# Patient Record
Sex: Female | Born: 1995 | Race: White | Hispanic: No | Marital: Married | Smoking: Never smoker
Health system: Southern US, Community
[De-identification: ages and names within clinical notes are randomized; demographics above are authoritative.]

## PROBLEM LIST (undated history)

## (undated) DIAGNOSIS — J45909 Unspecified asthma, uncomplicated: Secondary | ICD-10-CM

## (undated) DIAGNOSIS — F419 Anxiety disorder, unspecified: Secondary | ICD-10-CM

## (undated) DIAGNOSIS — F329 Major depressive disorder, single episode, unspecified: Secondary | ICD-10-CM

## (undated) DIAGNOSIS — R519 Headache, unspecified: Secondary | ICD-10-CM

## (undated) DIAGNOSIS — F32A Depression, unspecified: Secondary | ICD-10-CM

## (undated) HISTORY — DX: Depression, unspecified: F32.A

## (undated) HISTORY — DX: Major depressive disorder, single episode, unspecified: F32.9

## (undated) HISTORY — PX: CYSTECTOMY: SUR359

## (undated) HISTORY — PX: WISDOM TOOTH EXTRACTION: SHX21

## (undated) HISTORY — DX: Unspecified asthma, uncomplicated: J45.909

## (undated) HISTORY — PX: TONSILLECTOMY: SUR1361

## (undated) HISTORY — DX: Headache, unspecified: R51.9

## (undated) HISTORY — DX: Anxiety disorder, unspecified: F41.9

---
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
## 2016-04-22 ENCOUNTER — Encounter: Payer: Self-pay | Admitting: Neurology

## 2016-04-22 ENCOUNTER — Ambulatory Visit (INDEPENDENT_AMBULATORY_CARE_PROVIDER_SITE_OTHER): Payer: 59 | Admitting: Neurology

## 2016-04-22 VITALS — BP 109/66 | HR 77 | Ht 64.0 in | Wt 143.2 lb

## 2016-04-22 DIAGNOSIS — R51 Headache with orthostatic component, not elsewhere classified: Secondary | ICD-10-CM

## 2016-04-22 DIAGNOSIS — G43709 Chronic migraine without aura, not intractable, without status migrainosus: Secondary | ICD-10-CM | POA: Diagnosis not present

## 2016-04-22 DIAGNOSIS — R413 Other amnesia: Secondary | ICD-10-CM

## 2016-04-22 DIAGNOSIS — G8929 Other chronic pain: Secondary | ICD-10-CM

## 2016-04-22 DIAGNOSIS — R410 Disorientation, unspecified: Secondary | ICD-10-CM

## 2016-04-22 DIAGNOSIS — F05 Delirium due to known physiological condition: Secondary | ICD-10-CM

## 2016-04-22 DIAGNOSIS — S0990XA Unspecified injury of head, initial encounter: Secondary | ICD-10-CM

## 2016-04-22 MED ORDER — PROCHLORPERAZINE MALEATE 10 MG PO TABS: 10.0000 mg | ORAL_TABLET | Freq: Four times a day (QID) | ORAL | 4 refills | Status: DC | PRN

## 2016-04-22 MED ORDER — PROPRANOLOL HCL 10 MG PO TABS: 20.0000 mg | ORAL_TABLET | Freq: Two times a day (BID) | ORAL | 6 refills | Status: DC

## 2016-04-22 MED ORDER — METHYLPREDNISOLONE 4 MG PO TBPK: ORAL_TABLET | ORAL | 1 refills | Status: DC

## 2016-04-22 NOTE — Progress Notes (Signed)
Faxed printed rx compazine, inderal, and medrol dosepak to  [id number] - [place], [place] - [address] [phone] [phone] (Phone) [phone] (Fax)   Fax: [phone]. Received  Confirmation.  Pt in town this weekend and wanted to pick up from this pharmacy. [facility] [place]
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
## 2016-04-22 NOTE — Patient Instructions (Addendum)
Remember to drink plenty of fluid, eat healthy meals and do not skip any meals. Try to eat protein with a every meal and eat a healthy snack such as fruit or nuts in between meals. Try to keep a regular sleep-wake schedule and try to exercise daily, particularly in the form of walking, 20-30 minutes a day, if you can.   As far as your medications are concerned, I would like to suggest Propranolol 10mg  twice daily in 2-3 weeks can increase to 20mg  twice daily Compazine as needed for headache and nausea One week medrol dosepak. Take in the morning with food.   I would like to see you back in 3 months, sooner if we need to. Please call us with any interim questions, concerns, problems, updates or refill requests.   Our phone number is [phone]. We also have an after hours call service for urgent matters and there is a physician on-call for urgent questions. For any emergencies you know to call 911 or go to the nearest emergency room  Propranolol tablets What is this medicine? PROPRANOLOL (proe PRAN oh lole) is a beta-blocker. Beta-blockers reduce the workload on the heart and help it to beat more regularly. This medicine is used to treat high blood pressure, to control irregular heart rhythms (arrhythmias) and to relieve chest pain caused by angina. It may also be helpful after a heart attack. This medicine is also used to prevent migraine headaches, relieve uncontrollable shaking (tremors), and help certain problems related to the thyroid gland and adrenal gland. This medicine may be used for other purposes; ask your health care provider or pharmacist if you have questions. What should I tell my health care provider before I take this medicine? They need to know if you have any of these conditions: -circulation problems or blood vessel disease -diabetes -history of heart attack or heart disease, vasospastic angina -kidney disease -liver disease -lung or breathing disease, like asthma or  emphysema -pheochromocytoma -slow heart rate -thyroid disease -an unusual or allergic reaction to propranolol, other beta-blockers, medicines, foods, dyes, or preservatives -pregnant or trying to get pregnant -breast-feeding How should I use this medicine? Take this medicine by mouth with a glass of water. Follow the directions on the prescription label. Take your doses at regular intervals. Do not take your medicine more often than directed. Do not stop taking except on your the advice of your doctor or health care professional. Talk to your pediatrician regarding the use of this medicine in children. Special care may be needed. Overdosage: If you think you have taken too much of this medicine contact a poison control center or emergency room at once. NOTE: This medicine is only for you. Do not share this medicine with others. What if I miss a dose? If you miss a dose, take it as soon as you can. If it is almost time for your next dose, take only that dose. Do not take double or extra doses. What may interact with this medicine? Do not take this medicine with any of the following medications: -feverfew -phenothiazines like chlorpromazine, mesoridazine, prochlorperazine, thioridazine This medicine may also interact with the following medications: -aluminum hydroxide gel -antipyrine -antiviral medicines for HIV or AIDS -barbiturates like phenobarbital -certain medicines for blood pressure, heart disease, irregular heart beat -cimetidine -ciprofloxacin -diazepam -fluconazole -haloperidol -isoniazid -medicines for cholesterol like cholestyramine or colestipol -medicines for mental depression -medicines for migraine headache like almotriptan, eletriptan, frovatriptan, naratriptan, rizatriptan, sumatriptan, zolmitriptan -NSAIDs, medicines for pain and inflammation, like ibuprofen  or naproxen -phenytoin -rifampin -teniposide -theophylline -thyroid  medicines -tolbutamide -warfarin -zileuton This list may not describe all possible interactions. Give your health care provider a list of all the medicines, herbs, non-prescription drugs, or dietary supplements you use. Also tell them if you smoke, drink alcohol, or use illegal drugs. Some items may interact with your medicine. What should I watch for while using this medicine? Visit your doctor or health care professional for regular check ups. Check your blood pressure and pulse rate regularly. Ask your health care professional what your blood pressure and pulse rate should be, and when you should contact them. You may get drowsy or dizzy. Do not drive, use machinery, or do anything that needs mental alertness until you know how this drug affects you. Do not stand or sit up quickly, especially if you are an older patient. This reduces the risk of dizzy or fainting spells. Alcohol can make you more drowsy and dizzy. Avoid alcoholic drinks. This medicine can affect blood sugar levels. If you have diabetes, check with your doctor or health care professional before you change your diet or the dose of your diabetic medicine. Do not treat yourself for coughs, colds, or pain while you are taking this medicine without asking your doctor or health care professional for advice. Some ingredients may increase your blood pressure. What side effects may I notice from receiving this medicine? Side effects that you should report to your doctor or health care professional as soon as possible: -allergic reactions like skin rash, itching or hives, swelling of the face, lips, or tongue -breathing problems -changes in blood sugar -cold hands or feet -difficulty sleeping, nightmares -dry peeling skin -hallucinations -muscle cramps or weakness -slow heart rate -swelling of the legs and ankles -vomiting Side effects that usually do not require medical attention (report to your doctor or health care professional if  they continue or are bothersome): -change in sex drive or performance -diarrhea -dry sore eyes -hair loss -nausea -weak or tired This list may not describe all possible side effects. Call your doctor for medical advice about side effects. You may report side effects to FDA at 1-800-FDA-1088. Where should I keep my medicine? Keep out of the reach of children. Store at room temperature between 15 and 30 degrees C (59 and 86 degrees F). Protect from light. Throw away any unused medicine after the expiration date. NOTE: This sheet is a summary. It may not cover all possible information. If you have questions about this medicine, talk to your doctor, pharmacist, or health care provider.    2016, Elsevier/Gold Standard. (2013-02-01 14:51:53)   Prochlorperazine tablets What is this medicine? PROCHLORPERAZINE (proe klor PER a zeen) helps to control severe nausea and vomiting. This medicine is also used to treat schizophrenia. It can also help patients who experience anxiety that is not due to psychological illness. This medicine may be used for other purposes; ask your health care provider or pharmacist if you have questions. What should I tell my health care provider before I take this medicine? They need to know if you have any of these conditions: -blood disorders or disease -dementia -liver disease or jaundice -Parkinson's disease -uncontrollable movement disorder -an unusual or allergic reaction to prochlorperazine, other medicines, foods, dyes, or preservatives -pregnant or trying to get pregnant -breast-feeding How should I use this medicine? Take this medicine by mouth with a glass of water. Follow the directions on the prescription label. Take your doses at regular intervals. Do not take your  medicine more often than directed. Do not stop taking this medicine suddenly. This can cause nausea, vomiting, and dizziness. Ask your doctor or health care professional for advice. Talk to  your pediatrician regarding the use of this medicine in children. Special care may be needed. While this drug may be prescribed for children as young as 2 years for selected conditions, precautions do apply. Overdosage: If you think you have taken too much of this medicine contact a poison control center or emergency room at once. NOTE: This medicine is only for you. Do not share this medicine with others. What if I miss a dose? If you miss a dose, take it as soon as you can. If it is almost time for your next dose, take only that dose. Do not take double or extra doses. What may interact with this medicine? Do not take this medicine with any of the following medications: -amoxapine -antidepressants like citalopram, escitalopram, fluoxetine, paroxetine, and sertraline -deferoxamine -dofetilide -maprotiline -tricyclic antidepressants like amitriptyline, clomipramine, imipramine, nortiptyline and others This medicine may also interact with the following medications: -lithium -medicines for pain -phenytoin -propranolol -warfarin This list may not describe all possible interactions. Give your health care provider a list of all the medicines, herbs, non-prescription drugs, or dietary supplements you use. Also tell them if you smoke, drink alcohol, or use illegal drugs. Some items may interact with your medicine. What should I watch for while using this medicine? Visit your doctor or health care professional for regular checks on your progress. You may get drowsy or dizzy. Do not drive, use machinery, or do anything that needs mental alertness until you know how this medicine affects you. Do not stand or sit up quickly, especially if you are an older patient. This reduces the risk of dizzy or fainting spells. Alcohol may interfere with the effect of this medicine. Avoid alcoholic drinks. This medicine can reduce the response of your body to heat or cold. Dress warm in cold weather and stay hydrated  in hot weather. If possible, avoid extreme temperatures like saunas, hot tubs, very hot or cold showers, or activities that can cause dehydration such as vigorous exercise. This medicine can make you more sensitive to the sun. Keep out of the sun. If you cannot avoid being in the sun, wear protective clothing and use sunscreen. Do not use sun lamps or tanning beds/booths. Your mouth may get dry. Chewing sugarless gum or sucking hard candy, and drinking plenty of water may help. Contact your doctor if the problem does not go away or is severe. What side effects may I notice from receiving this medicine? Side effects that you should report to your doctor or health care professional as soon as possible: -blurred vision -breast enlargement in men or women -breast milk in women who are not breast-feeding -chest pain, fast or irregular heartbeat -confusion, restlessness -dark yellow or brown urine -difficulty breathing or swallowing -dizziness or fainting spells -drooling, shaking, movement difficulty (shuffling walk) or rigidity -fever, chills, sore throat -involuntary or uncontrollable movements of the eyes, mouth, head, arms, and legs -seizures -stomach area pain -unusually weak or tired -unusual bleeding or bruising -yellowing of skin or eyes Side effects that usually do not require medical attention (report to your doctor or health care professional if they continue or are bothersome): -difficulty passing urine -difficulty sleeping -headache -sexual dysfunction -skin rash, or itching This list may not describe all possible side effects. Call your doctor for medical advice about side effects. You may  report side effects to FDA at 1-800-FDA-1088. Where should I keep my medicine? Keep out of the reach of children. Store at room temperature between 15 and 30 degrees C (59 and 86 degrees F). Protect from light. Throw away any unused medicine after the expiration date. NOTE: This sheet is a  summary. It may not cover all possible information. If you have questions about this medicine, talk to your doctor, pharmacist, or health care provider.    2016, Elsevier/Gold Standard. (2011-10-18 16:59:39)   Methylprednisolone tablets What is this medicine? METHYLPREDNISOLONE (meth ill pred NISS oh lone) is a corticosteroid. It is commonly used to treat inflammation of the skin, joints, lungs, and other organs. Common conditions treated include asthma, allergies, and arthritis. It is also used for other conditions, such as blood disorders and diseases of the adrenal glands. This medicine may be used for other purposes; ask your health care provider or pharmacist if you have questions. What should I tell my health care provider before I take this medicine? They need to know if you have any of these conditions: -Cushing's syndrome -diabetes -glaucoma -heart problems or disease -high blood pressure -infection such as herpes, measles, tuberculosis, or chickenpox -kidney disease -liver disease -mental problems -myasthenia gravis -osteoporosis -seizures -stomach ulcer or intestine disease including colitis and diverticulitis -thyroid problem -an unusual or allergic reaction to lactose, methylprednisolone, other medicines, foods, dyes, or preservatives -pregnant or trying to get pregnant -breast-feeding How should I use this medicine? Take this medicine by mouth with a drink of water. Follow the directions on the prescription label. Take it with food or milk to avoid stomach upset. If you are taking this medicine once a day, take it in the morning. Do not take more medicine than you are told to take. Do not suddenly stop taking your medicine because you may develop a severe reaction. Your doctor will tell you how much medicine to take. If your doctor wants you to stop the medicine, the dose may be slowly lowered over time to avoid any side effects. Talk to your pediatrician regarding the  use of this medicine in children. Special care may be needed. Overdosage: If you think you have taken too much of this medicine contact a poison control center or emergency room at once. NOTE: This medicine is only for you. Do not share this medicine with others. What if I miss a dose? If you miss a dose, take it as soon as you can. If it is almost time for your next dose, talk to your doctor or health care professional. You may need to miss a dose or take an extra dose. Do not take double or extra doses without advice. What may interact with this medicine? Do not take this medicine with any of the following medications: -mifepristone This medicine may also interact with the following medications: -tacrolimus -vaccines -warfarin This list may not describe all possible interactions. Give your health care provider a list of all the medicines, herbs, non-prescription drugs, or dietary supplements you use. Also tell them if you smoke, drink alcohol, or use illegal drugs. Some items may interact with your medicine. What should I watch for while using this medicine? Visit your doctor or health care professional for regular checks on your progress. If you are taking this medicine for a long time, carry an identification card with your name and address, the type and dose of your medicine, and your doctor's name and address. The medicine may increase your risk of getting an  infection. Stay away from people who are sick. Tell your doctor or health care professional if you are around anyone with measles or chickenpox. If you are going to have surgery, tell your doctor or health care professional that you have taken this medicine within the last twelve months. Ask your doctor or health care professional about your diet. You may need to lower the amount of salt you eat. The medicine can increase your blood sugar. If you are a diabetic check with your doctor if you need help adjusting the dose of your diabetic  medicine. What side effects may I notice from receiving this medicine? Side effects that you should report to your doctor or health care professional as soon as possible: -allergic reactions like skin rash, itching or hives, swelling of the face, lips, or tongue -eye pain, decreased or blurred vision, or bulging eyes -fever, sore throat, sneezing, cough, or other signs of infection, wounds that will not heal -increased thirst -mental depression, mood swings, mistaken feelings of self importance or of being mistreated -pain in hips, back, ribs, arms, shoulders, or legs -swelling of the ankles, feet, hands -trouble passing urine or change in the amount of urine Side effects that usually do not require medical attention (report to your doctor or health care professional if they continue or are bothersome): -confusion, excitement, restlessness -headache -nausea, vomiting -skin problems, acne, thin and shiny skin -weight gain This list may not describe all possible side effects. Call your doctor for medical advice about side effects. You may report side effects to FDA at 1-800-FDA-1088. Where should I keep my medicine? Keep out of the reach of children. Store at room temperature between 20 and 25 degrees C (68 and 77 degrees F). Throw away any unused medicine after the expiration date. NOTE: This sheet is a summary. It may not cover all possible information. If you have questions about this medicine, talk to your doctor, pharmacist, or health care provider.    2016, Elsevier/Gold Standard. (2012-02-28 11:38:34)

## 2016-04-23 ENCOUNTER — Encounter: Payer: Self-pay | Admitting: Neurology

## 2016-04-23 DIAGNOSIS — G43709 Chronic migraine without aura, not intractable, without status migrainosus: Secondary | ICD-10-CM | POA: Insufficient documentation

## 2016-07-05 ENCOUNTER — Telehealth: Payer: Self-pay | Admitting: *Deleted

## 2016-07-05 NOTE — Telephone Encounter (Signed)
Received mychart message in mothers mychart from mother: "My daughter, Hannah Clark, sees Dr. Lucia GaskinsAhern. She is not supposed to come back until March 8th. She came in to see Dr. Lucia GaskinsAhern because of headaches from a concussion from a year ago. She has me listed on her medical chart as a contact for [facility]. She is still having headaches everyday, ringing in her ears, and sometimes she gets dizzy. She tried taking double the Propronal, as suggested but it made her sick on her stomach. Dr. Lucia GaskinsAhern, do you think she needs to get a CT scan or something else done in [place], [place] sooner than March? She goes to school at [employer] and will be in [place] until she comes home in March. She has class until 9pm tonight and asked that I try to get in touch with you. 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 Thank you for looking into this for me. Lina SayreMarsha Doss [phone]"  I replied to mother and asked her to send any further questions about her daughter with her daughter's mychart.   I called and LVM for patient to call and discuss her symptoms. Advised our office is closed, we open tomorrow at 9am.  Dr Lucia GaskinsAhern is wanting to try her on medication, nortriptyline for her to take daily at night.   Side Effects:  Frequent:  Drowsiness, fatigue, dry mouth, blurred vision, constipation, delayed micturition, orthostatic hypotension, diaphoresis, impaired concentration, increased appetite, urinary retention.  Occasional:  GI disturbances (nausea, GI distress, metallic taste), photosensitivity.  Rare:  Paradoxical reactions (agitation, restlessness, nightmares, insomnia), extrapyramidal symptoms (particularly fine hand tremor).

## 2016-07-12 ENCOUNTER — Other Ambulatory Visit: Payer: Self-pay | Admitting: Neurology

## 2016-07-12 MED ORDER — NORTRIPTYLINE HCL 10 MG PO CAPS: 10.0000 mg | ORAL_CAPSULE | Freq: Every day | ORAL | 6 refills | Status: DC

## 2016-07-12 NOTE — Telephone Encounter (Signed)
Called and spoke to pt. She wants to know if Dr Lucia GaskinsAhern wants to do any imaging first before starting a medication. Her and her mom were talking and she is concerned that she has not had any imaging since her concussion from a year ago. Advised I will send message to Dr Lucia GaskinsAhern and call her back to advise. She will not be back from school until March.   I did discuss nortriptyline with patient and advised she would take this instead of propranolol. She would take once daily at bedtime. I went over common/serious SE.  If Dr Lucia GaskinsAhern wants her to start she would like rx sent to: [facility] [id number] [place] [place], [place] - [phone] (Phone) [phone] (Fax)

## 2016-07-12 NOTE — Telephone Encounter (Signed)
Called and LVM for pt to call to discuss headaches/management. Gave [facility] phone number.

## 2016-07-13 NOTE — Telephone Encounter (Signed)
I called patient. Left her a message. I will order an MRI of the brain and see if insurance pays for it but need to know where she wants it done since she is at school. I prefer it here so that I can see the images otherwise she will have to mail me a disk but that is workable too. She can start the medication in the meantime. Asked her to call back and [facility] to see how she wanted to proceed with the MRI thanks

## 2016-07-13 NOTE — Telephone Encounter (Signed)
Dr Lucia GaskinsAhern- can you call patient? Thank you

## 2016-07-13 NOTE — Telephone Encounter (Signed)
We can start the medication first and we can also order an MRI of the brain but patient is at school, can she come back for the MRI? Or when will she be back?

## 2016-07-13 NOTE — Telephone Encounter (Signed)
Dr Lucia GaskinsAhern- patient calling back again. Please advise.

## 2016-07-13 NOTE — Telephone Encounter (Signed)
Patient called in reference to message below to see if there has been any updates.  Please call

## 2016-07-14 NOTE — Addendum Note (Signed)
Addended by: Naomie DeanAHERN, ANTONIA B on: 07/14/2016 12:01 PM   Modules accepted: Orders

## 2016-07-14 NOTE — Telephone Encounter (Signed)
Took call from phone staff. Relayed Dr Trevor MaceAhern's message below. Pt is willing to have imaging done in [place]. Advised I will send to Dr Lucia GaskinsAhern to order.  She will be called to schedule MRI.   She asked how long it takes for MRI results to come back. Advised on average, she should hear within that next week after she has test done about results. She verbalized understanding  Her husband is going to pick up new rx nortriptyline for her to start taking. She will call if she has any side effects.

## 2016-07-15 ENCOUNTER — Telehealth: Payer: Self-pay | Admitting: Neurology

## 2016-07-15 MED ORDER — ALPRAZOLAM 0.5 MG PO TABS: ORAL_TABLET | ORAL | 0 refills | Status: DC

## 2016-07-15 NOTE — Telephone Encounter (Signed)
Patient is going to have [facility] but she stated she is Claustrophobia and will need something to calm her nerves.. Also she said that her medication were changed and thinks there is a problem with the change.

## 2016-07-15 NOTE — Telephone Encounter (Signed)
Faxed printed/signed rx xanax to pt pharmacy as requested. Fax: [phone]. Received confirmation.

## 2016-07-15 NOTE — Telephone Encounter (Signed)
Called and spoke to pt. She stated she is already on an antidepressant and wanted to make sure it was ok for her to start the nortriptyline. She was reading with her husband that taking together with the lexapro could cause SE. Advised I will speak with Dr Lucia GaskinsAhern and call back to let her know. She verbalized understanding.  She also would like something to take prior to MRI for nerves. Advised we can give her xanax 0.5 mg, qty3 tabs no refills. She can take 1-2 tabs 30-5160min prior to MRI. Can take additional tab at time of test if needed. Must have driver. Can cause sedation. She stated she will have her husband or mother take her. She requested rx be sent to:   [id number] [place],  - [address] [phone] (Phone) [phone] (Fax)

## 2016-07-15 NOTE — Telephone Encounter (Signed)
She can take nortriptyline that is fine thanks

## 2016-07-15 NOTE — Telephone Encounter (Signed)
Called and spoke to pt. Advised AA,MD ok with her taking nortriptyline with other medication. Also advised I sent rx to her pharmacy as requested. She verbalized understanding.

## 2016-07-24 ENCOUNTER — Ambulatory Visit
Admission: RE | Admit: 2016-07-24 | Discharge: 2016-07-24 | Disposition: A | Discharge status: Home / Self Care | Payer: 59 | Source: Ambulatory Visit | Attending: Neurology | Admitting: Neurology

## 2016-07-24 DIAGNOSIS — G43709 Chronic migraine without aura, not intractable, without status migrainosus: Secondary | ICD-10-CM

## 2016-07-24 DIAGNOSIS — S0990XA Unspecified injury of head, initial encounter: Secondary | ICD-10-CM | POA: Diagnosis not present

## 2016-07-24 DIAGNOSIS — F05 Delirium due to known physiological condition: Secondary | ICD-10-CM

## 2016-07-24 DIAGNOSIS — G8929 Other chronic pain: Secondary | ICD-10-CM

## 2016-07-24 DIAGNOSIS — R413 Other amnesia: Secondary | ICD-10-CM

## 2016-07-24 DIAGNOSIS — R51 Headache with orthostatic component, not elsewhere classified: Secondary | ICD-10-CM

## 2016-07-24 DIAGNOSIS — R410 Disorientation, unspecified: Secondary | ICD-10-CM

## 2016-07-24 MED ORDER — GADOBENATE DIMEGLUMINE 529 MG/ML IV SOLN
13.0000 mL | Freq: Once | INTRAVENOUS | Status: AC | PRN
  Administered 2016-07-24: 13 mL via INTRAVENOUS

## 2016-07-27 ENCOUNTER — Telehealth: Payer: Self-pay

## 2016-07-27 NOTE — Telephone Encounter (Signed)
Left vm on patient number to call back about MR brain results.

## 2016-07-27 NOTE — Telephone Encounter (Signed)
-----   Message from Anson FretAntonia B Ahern, MD sent at 07/25/2016  9:16 AM EST ----- Mri brain is normal would u check and see how she is doing with headaches? thanks

## 2016-07-28 NOTE — Telephone Encounter (Signed)
Called pt w/ unremarkable lab results. May call back w/ additional questions and w/ update on headaches.

## 2016-07-28 NOTE — Telephone Encounter (Signed)
-----   Message from Antonia B Ahern, MD sent at 07/25/2016  9:16 AM EST ----- Mri brain is normal would u check and see how she is doing with headaches? thanks 

## 2016-08-18 ENCOUNTER — Encounter: Payer: Self-pay | Admitting: Neurology

## 2016-08-18 ENCOUNTER — Ambulatory Visit (INDEPENDENT_AMBULATORY_CARE_PROVIDER_SITE_OTHER): Payer: 59 | Admitting: Neurology

## 2016-08-18 VITALS — BP 121/74 | HR 101 | Ht 64.0 in | Wt 146.0 lb

## 2016-08-18 DIAGNOSIS — G43709 Chronic migraine without aura, not intractable, without status migrainosus: Secondary | ICD-10-CM | POA: Diagnosis not present

## 2016-08-18 MED ORDER — SUMATRIPTAN SUCCINATE 100 MG PO TABS: 100.0000 mg | ORAL_TABLET | Freq: Once | ORAL | 12 refills | Status: DC | PRN

## 2016-08-18 MED ORDER — TOPIRAMATE 25 MG PO TABS: 50.0000 mg | ORAL_TABLET | Freq: Every day | ORAL | 12 refills | Status: DC

## 2016-08-18 NOTE — Patient Instructions (Signed)
Remember to drink plenty of fluid, eat healthy meals and do not skip any meals. Try to eat protein with a every meal and eat a healthy snack such as fruit or nuts in between meals. Try to keep a regular sleep-wake schedule and try to exercise daily, particularly in the form of walking, 20-30 minutes a day, if you can.   As far as your medications are concerned, I would like to suggest: Topiramate 25mg  at night for 3-7 days and then increase to 50mg  at night. Email me in 2-4 weeks for adjustments. Imitrex: Please take one tablet at the onset of your headache. If it does not improve the symptoms please take one additional tablet in 2 hours. Do not take more then 2 tablets in 24hrs. Do not take use more then 2 to 3 times in a week.  I would like to see you back in 4-6 months, sooner if we need to. Please call us with any interim questions, concerns, problems, updates or refill requests.   Our phone number is [phone]. We also have an after hours call service for urgent matters and there is a physician on-call for urgent questions. For any emergencies you know to call 911 or go to the nearest emergency room  Topiramate tablets What is this medicine? TOPIRAMATE (toe PYRE a mate) is used to treat seizures in adults or children with epilepsy. It is also used for the prevention of migraine headaches. This medicine may be used for other purposes; ask your health care provider or pharmacist if you have questions. COMMON BRAND NAME(S): Topamax, Topiragen What should I tell my health care provider before I take this medicine? They need to know if you have any of these conditions: -bleeding disorders -cirrhosis of the liver or liver disease -diarrhea -glaucoma -kidney stones or kidney disease -low blood counts, like low white cell, platelet, or red cell counts -lung disease like asthma, obstructive pulmonary disease, emphysema -metabolic acidosis -on a ketogenic diet -schedule for surgery or a  procedure -suicidal thoughts, plans, or attempt; a previous suicide attempt by you or a family member -an unusual or allergic reaction to topiramate, other medicines, foods, dyes, or preservatives -pregnant or trying to get pregnant -breast-feeding How should I use this medicine? Take this medicine by mouth with a glass of water. Follow the directions on the prescription label. Do not crush or chew. You may take this medicine with meals. Take your medicine at regular intervals. Do not take it more often than directed. Talk to your pediatrician regarding the use of this medicine in children. Special care may be needed. While this drug may be prescribed for children as young as 52 years of age for selected conditions, precautions do apply. Overdosage: If you think you have taken too much of this medicine contact a poison control center or emergency room at once. NOTE: This medicine is only for you. Do not share this medicine with others. What if I miss a dose? If you miss a dose, take it as soon as you can. If your next dose is to be taken in less than 6 hours, then do not take the missed dose. Take the next dose at your regular time. Do not take double or extra doses. What may interact with this medicine? Do not take this medicine with any of the following medications: -probenecid This medicine may also interact with the following medications: -acetazolamide -alcohol -amitriptyline -aspirin and aspirin-like medicines -birth control pills -certain medicines for depression -certain medicines for  seizures -certain medicines that treat or prevent blood clots like warfarin, enoxaparin, dalteparin, apixaban, dabigatran, and rivaroxaban -digoxin -hydrochlorothiazide -lithium -medicines for pain, sleep, or muscle relaxation -metformin -methazolamide -NSAIDS, medicines for pain and inflammation, like ibuprofen or naproxen -pioglitazone -risperidone This list may not describe all possible  interactions. Give your health care provider a list of all the medicines, herbs, non-prescription drugs, or dietary supplements you use. Also tell them if you smoke, drink alcohol, or use illegal drugs. Some items may interact with your medicine. What should I watch for while using this medicine? Visit your doctor or health care professional for regular checks on your progress. Do not stop taking this medicine suddenly. This increases the risk of seizures if you are using this medicine to control epilepsy. Wear a medical identification bracelet or chain to say you have epilepsy or seizures, and carry a card that lists all your medicines. This medicine can decrease sweating and increase your body temperature. Watch for signs of deceased sweating or fever, especially in children. Avoid extreme heat, hot baths, and saunas. Be careful about exercising, especially in hot weather. Contact your health care provider right away if you notice a fever or decrease in sweating. You should drink plenty of fluids while taking this medicine. If you have had kidney stones in the past, this will help to reduce your chances of forming kidney stones. If you have stomach pain, with nausea or vomiting and yellowing of your eyes or skin, call your doctor immediately. You may get drowsy, dizzy, or have blurred vision. Do not drive, use machinery, or do anything that needs mental alertness until you know how this medicine affects you. To reduce dizziness, do not sit or stand up quickly, especially if you are an older patient. Alcohol can increase drowsiness and dizziness. Avoid alcoholic drinks. If you notice blurred vision, eye pain, or other eye problems, seek medical attention at once for an eye exam. The use of this medicine may increase the chance of suicidal thoughts or actions. Pay special attention to how you are responding while on this medicine. Any worsening of mood, or thoughts of suicide or dying should be reported to  your health care professional right away. This medicine may increase the chance of developing metabolic acidosis. If left untreated, this can cause kidney stones, bone disease, or slowed growth in children. Symptoms include breathing fast, fatigue, loss of appetite, irregular heartbeat, or loss of consciousness. Call your doctor immediately if you experience any of these side effects. Also, tell your doctor about any surgery you plan on having while taking this medicine since this may increase your risk for metabolic acidosis. Birth control pills may not work properly while you are taking this medicine. Talk to your doctor about using an extra method of birth control. Women who become pregnant while using this medicine may enroll in the [place] [employer] Antiepileptic Drug Pregnancy Registry by calling [phone]. This registry collects information about the safety of antiepileptic drug use during pregnancy. What side effects may I notice from receiving this medicine? Side effects that you should report to your doctor or health care professional as soon as possible: -allergic reactions like skin rash, itching or hives, swelling of the face, lips, or tongue -decreased sweating and/or rise in body temperature -depression -difficulty breathing, fast or irregular breathing patterns -difficulty speaking -difficulty walking or controlling muscle movements -hearing impairment -redness, blistering, peeling or loosening of the skin, including inside the mouth -tingling, pain or numbness in the hands or  feet -unusual bleeding or bruising -unusually weak or tired -worsening of mood, thoughts or actions of suicide or dying Side effects that usually do not require medical attention (report to your doctor or health care professional if they continue or are bothersome): -altered taste -back pain, joint or muscle aches and pains -diarrhea, or constipation -headache -loss of appetite -nausea -stomach upset,  indigestion -tremors This list may not describe all possible side effects. Call your doctor for medical advice about side effects. You may report side effects to FDA at 1-800-FDA-1088. Where should I keep my medicine? Keep out of the reach of children. Store at room temperature between 15 and 30 degrees C (59 and 86 degrees F) in a tightly closed container. Protect from moisture. Throw away any unused medicine after the expiration date. NOTE: This sheet is a summary. It may not cover all possible information. If you have questions about this medicine, talk to your doctor, pharmacist, or health care provider.  2018 [employer] Standard (2013-06-03 23:17:57)  Sumatriptan tablets What is this medicine? SUMATRIPTAN (soo ma TRIP tan) is used to treat migraines with or without aura. An aura is a strange feeling or visual disturbance that warns you of an attack. It is not used to prevent migraines. This medicine may be used for other purposes; ask your health care provider or pharmacist if you have questions. COMMON BRAND NAME(S): Imitrex, Migraine Pack What should I tell my health care provider before I take this medicine? They need to know if you have any of these conditions: -circulation problems in fingers and toes -diabetes -heart disease -high blood pressure -high cholesterol -history of irregular heartbeat -history of stroke -kidney disease -liver disease -postmenopausal or surgical removal of uterus and ovaries -seizures -smoke tobacco -stomach or intestine problems -an unusual or allergic reaction to sumatriptan, other medicines, foods, dyes, or preservatives -pregnant or trying to get pregnant -breast-feeding How should I use this medicine? Take this medicine by mouth with a glass of water. Follow the directions on the prescription label. This medicine is taken at the first symptoms of a migraine. It is not for everyday use. If your migraine headache returns after one dose, you  can take another dose as directed. You must leave at least 2 hours between doses, and do not take more than 100 mg as a single dose. Do not take more than 200 mg total in any 24 hour period. If there is no improvement at all after the first dose, do not take a second dose without talking to your doctor or health care professional. Do not take your medicine more often than directed. Talk to your pediatrician regarding the use of this medicine in children. Special care may be needed. Overdosage: If you think you have taken too much of this medicine contact a poison control center or emergency room at once. NOTE: This medicine is only for you. Do not share this medicine with others. What if I miss a dose? This does not apply; this medicine is not for regular use. What may interact with this medicine? Do not take this medicine with any of the following medicines: -cocaine -ergot alkaloids like dihydroergotamine, ergonovine, ergotamine, methylergonovine -feverfew -MAOIs like Carbex, Eldepryl, Marplan, Nardil, and Parnate -other medicines for migraine headache like almotriptan, eletriptan, frovatriptan, naratriptan, rizatriptan, zolmitriptan -tryptophan This medicine may also interact with the following medications: -certain medicines for depression, anxiety, or psychotic disturbances This list may not describe all possible interactions. Give your health care provider a list of all  the medicines, herbs, non-prescription drugs, or dietary supplements you use. Also tell them if you smoke, drink alcohol, or use illegal drugs. Some items may interact with your medicine. What should I watch for while using this medicine? Only take this medicine for a migraine headache. Take it if you get warning symptoms or at the start of a migraine attack. It is not for regular use to prevent migraine attacks. You may get drowsy or dizzy. Do not drive, use machinery, or do anything that needs mental alertness until you  know how this medicine affects you. To reduce dizzy or fainting spells, do not sit or stand up quickly, especially if you are an older patient. Alcohol can increase drowsiness, dizziness and flushing. Avoid alcoholic drinks. Smoking cigarettes may increase the risk of heart-related side effects from using this medicine. If you take migraine medicines for 10 or more days a month, your migraines may get worse. Keep a diary of headache days and medicine use. Contact your healthcare professional if your migraine attacks occur more frequently. What side effects may I notice from receiving this medicine? Side effects that you should report to your doctor or health care professional as soon as possible: -allergic reactions like skin rash, itching or hives, swelling of the face, lips, or tongue -bloody or watery diarrhea -hallucination, loss of contact with reality -pain, tingling, numbness in the face, hands, or feet -seizures -signs and symptoms of a blood clot such as breathing problems; changes in vision; chest pain; severe, sudden headache; pain, swelling, warmth in the leg; trouble speaking; sudden numbness or weakness of the face, arm, or leg -signs and symptoms of a dangerous change in heartbeat or heart rhythm like chest pain; dizziness; fast or irregular heartbeat; palpitations, feeling faint or lightheaded; falls; breathing problems -signs and symptoms of a stroke like changes in vision; confusion; trouble speaking or understanding; severe headaches; sudden numbness or weakness of the face, arm, or leg; trouble walking; dizziness; loss of balance or coordination -stomach pain Side effects that usually do not require medical attention (report to your doctor or health care professional if they continue or are bothersome): -changes in taste -facial flushing -headache -muscle cramps -muscle pain -nausea, vomiting -weak or tired This list may not describe all possible side effects. Call your  doctor for medical advice about side effects. You may report side effects to FDA at 1-800-FDA-1088. Where should I keep my medicine? Keep out of the reach of children. Store at room temperature between 2 and 30 degrees C (36 and 86 degrees F). Throw away any unused medicine after the expiration date. NOTE: This sheet is a summary. It may not cover all possible information. If you have questions about this medicine, talk to your doctor, pharmacist, or health care provider.  2018 [employer]/Gold Standard (2015-07-02 12:38:23)
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
## 2017-01-03 ENCOUNTER — Ambulatory Visit: Payer: 59 | Admitting: Neurology

## 2017-02-22 ENCOUNTER — Ambulatory Visit: Payer: 59 | Admitting: Neurology

## 2019-09-03 LAB — OB RESULTS CONSOLE RUBELLA ANTIBODY, IGM: Rubella: NON-IMMUNE/NOT IMMUNE

## 2019-09-03 LAB — OB RESULTS CONSOLE RPR: RPR: NONREACTIVE

## 2019-09-03 LAB — OB RESULTS CONSOLE HIV ANTIBODY (ROUTINE TESTING): HIV: NONREACTIVE

## 2019-09-03 LAB — OB RESULTS CONSOLE ABO/RH: RH Type: POSITIVE

## 2019-09-03 LAB — OB RESULTS CONSOLE GC/CHLAMYDIA: Chlamydia: NEGATIVE

## 2019-09-03 LAB — OB RESULTS CONSOLE ANTIBODY SCREEN: Antibody Screen: NEGATIVE

## 2019-09-03 LAB — OB RESULTS CONSOLE HEPATITIS B SURFACE ANTIGEN: Hepatitis B Surface Ag: NEGATIVE

## 2019-09-19 ENCOUNTER — Telehealth (HOSPITAL_COMMUNITY): Payer: Self-pay | Admitting: *Deleted

## 2019-09-19 ENCOUNTER — Encounter (HOSPITAL_COMMUNITY): Payer: Self-pay | Admitting: *Deleted

## 2019-09-19 NOTE — Telephone Encounter (Signed)
Preadmission screen  

## 2019-09-20 ENCOUNTER — Telehealth (HOSPITAL_COMMUNITY): Payer: Self-pay | Admitting: *Deleted

## 2019-09-20 ENCOUNTER — Encounter (HOSPITAL_COMMUNITY): Payer: Self-pay | Admitting: *Deleted

## 2019-09-20 NOTE — Telephone Encounter (Signed)
Preadmission screen  

## 2019-09-25 ENCOUNTER — Other Ambulatory Visit (HOSPITAL_COMMUNITY)
Admission: RE | Admit: 2019-09-25 | Discharge: 2019-09-25 | Disposition: A | Discharge status: Home / Self Care | Payer: BC Managed Care – PPO | Source: Ambulatory Visit | Attending: Obstetrics | Admitting: Obstetrics

## 2019-09-25 DIAGNOSIS — Z20822 Contact with and (suspected) exposure to covid-19: Secondary | ICD-10-CM | POA: Diagnosis not present

## 2019-09-25 DIAGNOSIS — Z01812 Encounter for preprocedural laboratory examination: Secondary | ICD-10-CM | POA: Insufficient documentation

## 2019-09-25 LAB — SARS CORONAVIRUS 2 (TAT 6-24 HRS): SARS Coronavirus 2: NEGATIVE

## 2019-09-27 ENCOUNTER — Encounter (HOSPITAL_COMMUNITY): Payer: Self-pay | Admitting: Obstetrics

## 2019-09-27 ENCOUNTER — Other Ambulatory Visit: Payer: Self-pay

## 2019-09-27 ENCOUNTER — Other Ambulatory Visit (HOSPITAL_COMMUNITY): Payer: Self-pay | Admitting: *Deleted

## 2019-09-27 ENCOUNTER — Ambulatory Visit (HOSPITAL_COMMUNITY)
Admission: AD | Admit: 2019-09-27 | Discharge: 2019-09-27 | Disposition: A | Discharge status: Home / Self Care | Payer: BC Managed Care – PPO | Attending: Obstetrics | Admitting: Obstetrics

## 2019-09-27 ENCOUNTER — Observation Stay (HOSPITAL_COMMUNITY): Payer: BC Managed Care – PPO

## 2019-09-27 DIAGNOSIS — Z3A37 37 weeks gestation of pregnancy: Secondary | ICD-10-CM | POA: Diagnosis not present

## 2019-09-27 DIAGNOSIS — Z880 Allergy status to penicillin: Secondary | ICD-10-CM | POA: Diagnosis not present

## 2019-09-27 DIAGNOSIS — J45909 Unspecified asthma, uncomplicated: Secondary | ICD-10-CM | POA: Diagnosis not present

## 2019-09-27 DIAGNOSIS — O321XX0 Maternal care for breech presentation, not applicable or unspecified: Secondary | ICD-10-CM | POA: Insufficient documentation

## 2019-09-27 DIAGNOSIS — Z881 Allergy status to other antibiotic agents status: Secondary | ICD-10-CM | POA: Diagnosis not present

## 2019-09-27 DIAGNOSIS — O99513 Diseases of the respiratory system complicating pregnancy, third trimester: Secondary | ICD-10-CM | POA: Diagnosis not present

## 2019-09-27 LAB — CBC
HCT: 37.8 % (ref 36.0–46.0)
Hemoglobin: 12.1 g/dL (ref 12.0–15.0)
MCH: 28.2 pg (ref 26.0–34.0)
MCHC: 32 g/dL (ref 30.0–36.0)
MCV: 88.1 fL (ref 80.0–100.0)
Platelets: 133 10*3/uL — ABNORMAL LOW (ref 150–400)
RBC: 4.29 MIL/uL (ref 3.87–5.11)
RDW: 14.3 % (ref 11.5–15.5)
WBC: 10.3 10*3/uL (ref 4.0–10.5)
nRBC: 0 % (ref 0.0–0.2)

## 2019-09-27 LAB — TYPE AND SCREEN
ABO/RH(D): O POS
Antibody Screen: NEGATIVE

## 2019-09-27 LAB — ABO/RH: ABO/RH(D): O POS

## 2019-09-27 MED ORDER — TERBUTALINE SULFATE 1 MG/ML IJ SOLN
INTRAMUSCULAR | Status: AC
  Filled 2019-09-27: qty 1

## 2019-09-27 MED ORDER — TERBUTALINE SULFATE 1 MG/ML IJ SOLN
0.2500 mg | Freq: Once | INTRAMUSCULAR | Status: AC
  Administered 2019-09-27: 08:00:00 0.25 mg via SUBCUTANEOUS

## 2019-09-27 MED ORDER — LACTATED RINGERS IV SOLN: INTRAVENOUS | Status: DC

## 2019-09-27 NOTE — Discharge Instructions (Signed)
First Stage of Labor Labor is your body's natural process of moving your baby and other structures, including the placenta and umbilical cord, out of your uterus. There are three stages of labor. How long each stage lasts is different for every woman. But certain events happen during each stage that are the same for everyone.  The first stage starts when true labor begins. This stage ends when your cervix, which is the opening from your uterus into your vagina, is completely open (dilated).  The second stage begins when your cervix is fully dilated and you start pushing. This stage ends when your baby is born.  The third stage is the delivery of the organ that nourished your baby during pregnancy (placenta). First stage of labor As your due date gets closer, you may start to notice certain physical changes that mean labor is going to start soon. You may feel that your baby has dropped lower into your pelvis. You may experience irregular, often painless, contractions that go away when you walk around or lie down (Braxton Hicks contractions). This is also called false labor. The first stage of labor begins when you start having contractions that come at regular (evenly spaced) intervals and your cervix starts to get thinner and wider in preparation for your baby to pass through. Birth care providers measure the dilation of your cervix in centimeters (cm). One centimeter is a little less than one-half of an inch. The first stage ends when your cervix is dilated to 10 cm. The first stage of labor is divided into three phases:  Early phase.  Active phase.  Transitional phase. The length of the first stage of labor varies. It may be longer if this is your first pregnancy. You may spend most of this stage at home trying to relax and stay comfortable. How does this affect me? During the first stage of labor, you will move through three phases. What happens in the early phase?  You will start to have  regular contractions that last 30-60 seconds. Contractions may come every 5-20 minutes. Keep track of your contractions and call your birth care provider.  Your water may break during this phase.  You may notice a clear or slightly bloody discharge of mucus (mucus plug) from your vagina.  Your cervix will dilate to 3-6 cm. What happens in the active phase? The active phase usually lasts 3-5 hours. You may go to the hospital or birth center around this time. During the active phase:  Your contractions will become stronger, longer, and more uncomfortable.  Your contractions may last 45-90 seconds and come every 3-5 minutes.  You may feel lower back pain.  Your birth care providers may examine your cervix and feel your belly to find the position of your baby.  You may have a monitor strapped to your belly to measure your contractions and your baby's heart rate.  You may start using your pain management options.  Your cervix may be dilated to 6 cm and may start to dilate more quickly. What happens in the transitional phase? The transitional phase typically lasts from 30 minutes to 2 hours. At the end of this phase, your cervix will be fully dilated to 10 cm. During the transitional phase:  Contractions will get stronger and longer.  Contractions may last 60-90 seconds and come less than 2 minutes apart.  You may feel hot flashes, chills, or nausea. How does this affect my baby? During the first stage of labor, your baby will   gradually move down into your birth canal. Follow these instructions at home and in the hospital or birth center:   When labor first begins, try to stay calm. You are still in the early phase. If it is night, try to get some sleep. If it is day, try to relax and save your energy. You may want to make some calls and get ready to go to the hospital or birth center.  When you are in the early phase, try these methods to help ease discomfort: ? Deep breathing and  muscle relaxation. ? Taking a walk. ? Taking a warm bath or shower.  Drink some fluids and have a light snack if you feel like it.  Keep track of your contractions.  Based on the plan you created with your birth care provider, call when your contractions indicate it is time.  If your water breaks, note the time, color, and odor of the fluid.  When you are in the active phase, do your breathing exercises and rely on your support people and your team of birth care providers. Contact a health care provider if:  Your contractions are strong and regular.  You have lower back pain or cramping.  Your water breaks.  You lose your mucus plug. Get help right away if you:  Have a severe headache that does not go away.  Have changes in your vision.  Have severe pain in your upper belly.  Do not feel the baby move.  Have bright red bleeding. Summary  The first stage of labor starts when true labor begins, and it ends when your cervix is dilated to 10 cm.  The first stage of labor has three phases: early, active, and transitional.  Your baby moves into the birth canal during the first stage of labor.  You may have contractions that become stronger and longer. You may also lose your mucus plug and have your water break.  Call your birth care provider when your contractions are frequent and strong enough to go to the hospital or birth center. This information is not intended to replace advice given to you by your health care provider. Make sure you discuss any questions you have with your health care provider. Document Revised: 09/20/2018 Document Reviewed: 08/13/2017 Elsevier Patient Education  2020 [employer] Fetal Movement Counts Patient Name: ________________________________________________ Patient Due Date: ____________________ What is a fetal movement count?  A fetal movement count is the number of times that you feel your baby move during a certain amount of time. This  may also be called a fetal kick count. A fetal movement count is recommended for every pregnant woman. You may be asked to start counting fetal movements as early as week 28 of your pregnancy. Pay attention to when your baby is most active. You may notice your baby's sleep and wake cycles. You may also notice things that make your baby move more. You should do a fetal movement count:  When your baby is normally most active.  At the same time each day. A good time to count movements is while you are resting, after having something to eat and drink. How do I count fetal movements? 1. Find a quiet, comfortable area. Sit, or lie down on your side. 2. Write down the date, the start time and stop time, and the number of movements that you felt between those two times. Take this information with you to your health care visits. 3. Write down your start time when you feel  the first movement. 4. Count kicks, flutters, swishes, rolls, and jabs. You should feel at least 10 movements. 5. You may stop counting after you have felt 10 movements, or if you have been counting for 2 hours. Write down the stop time. 6. If you do not feel 10 movements in 2 hours, contact your health care provider for further instructions. Your health care provider may want to do additional tests to assess your baby's well-being. Contact a health care provider if:  You feel fewer than 10 movements in 2 hours.  Your baby is not moving like he or she usually does. Date: ____________ Start time: ____________ Stop time: ____________ Movements: ____________ Date: ____________ Start time: ____________ Stop time: ____________ Movements: ____________ Date: ____________ Start time: ____________ Stop time: ____________ Movements: ____________ Date: ____________ Start time: ____________ Stop time: ____________ Movements: ____________ Date: ____________ Start time: ____________ Stop time: ____________ Movements: ____________ Date:  ____________ Start time: ____________ Stop time: ____________ Movements: ____________ Date: ____________ Start time: ____________ Stop time: ____________ Movements: ____________ Date: ____________ Start time: ____________ Stop time: ____________ Movements: ____________ Date: ____________ Start time: ____________ Stop time: ____________ Movements: ____________ This information is not intended to replace advice given to you by your health care provider. Make sure you discuss any questions you have with your health care provider. Document Revised: 01/17/2019 Document Reviewed: 01/17/2019 Elsevier Patient Education  2020 [employer].

## 2019-09-27 NOTE — H&P (Addendum)
OB ADMISSION/ HISTORY & PHYSICAL:  Admission Date: 09/27/2019  7:02 AM  Admit Diagnosis: 37+1 weeks external cephalic version  Hannah Clark is a 24 y.o. female G1P0 at 37+1 weeks presenting for external cephalic version for breech presentation.    Prenatal History: G1P0   EDC : 10/17/2019, by Other [facility]  Prenatal care at at [place] until 54 weeks, then transferred to [place] for water birth option  Primary Ob Provider: CNM management Prenatal course complicated by  - breech presentation - rubella non-immune   Prenatal Labs: ABO, Rh: --/--/PENDING (04/16 0725) Antibody: PENDING (04/16 0725) Rubella: Nonimmune (03/23 0000)  RPR: Nonreactive (03/23 0000)  HBsAg: Negative (03/23 0000)  HIV: Non-reactive (03/23 0000)  GTT: 111 GBS:   pending Sono at 35 weeks: AGA 5#14@ 51%, AFI 16.2cm @ 71% Repeat sono at 36 weeks: normal AFI and frank breech   Medical / Surgical History :  Past medical history:  Past Medical History:  Diagnosis Date  . Anxiety   . Asthma   . Depression   . Headache      Past surgical history:  Past Surgical History:  Procedure Laterality Date  . TONSILLECTOMY    . WISDOM TOOTH EXTRACTION      Family History:  Family History  Problem Relation Age of Onset  . Hypertension Mother   . Depression Mother   . Fibromyalgia Mother   . Diverticulitis Mother   . Migraines Mother   . Diabetes Mother   . Hypertension Father   . Diverticulitis Father   . Heart disease Father   . Heart attack Father   . Depression Maternal Grandmother   . Diabetes Maternal Grandmother   . Fibromyalgia Maternal Grandmother   . Cirrhosis Maternal Grandmother   . Hypertension Paternal Grandmother   . Dementia Paternal Grandmother   . Heart attack Paternal Grandfather      Social History:  reports that she has never smoked. She has never used smokeless tobacco. She reports that she does not drink alcohol or use drugs.   Allergies: Penicillins,  Flonase [fluticasone propionate], Amoxicillin, Aviane [levonorgestrel-ethinyl estrad], and Erythromycin    Current Medications at time of admission:  Prior to Admission medications   Medication Sig Start Date End Date Taking? Authorizing Provider  CALCIUM PO Take 1 tablet by mouth daily.    [provider]  escitalopram (LEXAPRO) 10 MG tablet Take 10 mg by mouth daily.    [provider]  levalbuterol Penne Lash HFA) 45 MCG/ACT inhaler Inhale 2 puffs into the lungs every 4 (four) hours as needed for wheezing.    [provider]  medroxyPROGESTERone (DEPO-PROVERA) 150 MG/ML injection INJECT 1ML IN THE MUSCLE AS DIRECTED 03/25/16   [provider]  nortriptyline (PAMELOR) 10 MG capsule Take 1 capsule (10 mg total) by mouth at bedtime. 07/12/16   Melvenia Beam, MD  prochlorperazine (COMPAZINE) 10 MG tablet Take 1 tablet (10 mg total) by mouth every 6 (six) hours as needed for nausea or vomiting. 04/22/16   Melvenia Beam, MD  SUMAtriptan (IMITREX) 100 MG tablet Take 1 tablet (100 mg total) by mouth once as needed. May repeat in 2 hours if headache persists or recurs. Max 2 tabs in one day 08/18/16   Melvenia Beam, MD  topiramate (TOPAMAX) 25 MG tablet Take 2 tablets (50 mg total) by mouth at bedtime. 08/18/16   Melvenia Beam, MD     Review of Systems: Active FM No ctxs No LOF  No bloody show   Physical Exam:  VS: Blood pressure 124/82, pulse 72, temperature 98.6 F (37 C), temperature source Oral, resp. rate 18, height _0  (1.6 m), weight 82.1 kg.  General: alert and oriented, appears calm Heart: RRR Lungs: Clear lung fields Abdomen: Gravid, soft and non-tender, non-distended / uterus: gravid, breech by Leopold's Extremities: trace edema Breech by sono today   Genitalia / VE:    FHR: baseline rate 145 bpm / variability moderate / accelerations + / no decelerations TOCO: rare  Assessment: 37+[redacted] weeks gestation Pilar Plate Breech  presentation Category 1 strip   Plan:  1. External cephalic version  - Consent obtained. Risks of procedure including fetal distress leading to emergency cesarean section reviewed. Pt. Agrees.   Dr. Pamala Hurry, attending MD for procedure, aware of plan and agrees  Lars Pinks, MSN, CNM [facility]   Met with patient prior to procedure and discuss risks of version.  Patient aware of risks of placental abruption, starting labor, rupture of membranes, fetal distress all of which would require urgent cesarean section now or possibly if turns to vertex would need to stay and have labor induced.  Patient aware no guarantee of successful version.  If successful version no guarantee that she will have a vaginal delivery.  Patient aware that with a successful version there is a possibility that baby reverts to breech prior to the onset of labor.  Risks and benefits of C-section also reviewed with patient.  Also discussed with patient risks of delayed placental abruption and she needs play close attention to contractions bleeding and fetal movement over the next week specifically in the next 4 to 12 hours.  [name] 09/27/2019 11:28 AM

## 2019-09-27 NOTE — Progress Notes (Signed)
Procedure: Attempted external cephalic version  Subjective: Patient notes no contractions, does feel some shakiness since terbutaline is just been given, no bleeding, good fetal movement, no leakage of fluid.  Risk and benefits of attempted external cephalic version reviewed with patient  Objective: General well-appearing, no distress Skin: Warm and dry Abdomen: Gravid, nontender GU: Deferred  FHT 130s, positive accelerations no decelerations, 10 beat variability Toco: No contractions  Real-time bedside ultrasound: Frank breech position, fetal spine to maternal right, right fundal placenta, adequate AFI  Procedure: Informed consent obtained.  Ultrasound done as above, NST reviewed and reactive.  Given the decision was made to first attempt with a backward roll.  Using a hand to elevate the sacrum out of the pelvis, gentle counterclockwise pressure was placed on the baby's vertex with attempt for backward roll.  But 30 seconds were spent in attempt with significant tension of maternal abdominal muscles began.  Fetal heart rate was checked and normal and some resting time was given.  After a few minutes repeat attempt in the same fashion was done with inability to turn the baby.  While watching fetal heart rate bradycardia into the 70s for about 2 minutes.  Patient was rolled on her left side and fetus was monitored both with NST and ultrasound.  Heart rate responded and baby remained active with movement despite the deceleration.  Mom was rolled back on her back this time in a Trendelenburg position and decision was made to attempt a forward roll.  With elevating the sacrum out of the pelvis gentle pressure was placed in the fetal vertex directing towards maternal right for a forward roll attempt.  3 attempts lasting 30 seconds each were unable to turn the baby.  At this point decision was made to enter the procedure  Patient remains on NST and tocometry for the next hour.  Heart rate remained  reactive in the 130s with no contractions.  Patient noted no back pain no vaginal bleeding no leakage of fluid good fetal movement tractions.  Patient was discharged home with plan for a primary cesarean section for breech presentation.  Kick counts were advised and patient is to have follow-up in the office next week  Hannah Clark 09/27/2019 11:33 AM

## 2019-10-02 ENCOUNTER — Other Ambulatory Visit: Payer: Self-pay | Admitting: Obstetrics

## 2019-10-04 ENCOUNTER — Encounter (HOSPITAL_COMMUNITY): Payer: Self-pay

## 2019-10-04 NOTE — Patient Instructions (Signed)
Madellyn Denio Germany  10/04/2019   Your procedure is scheduled on:  10/12/2019  Arrive at 1000 at Entrance C on [employer] at [facility]  and [address]. You are invited to use the FREE valet parking or use the Visitor's parking deck.  Pick up the phone at the desk and dial [phone].  Call this number if you have problems the morning of surgery: [phone]  Remember:   Do not eat food:(After Midnight) Desps de medianoche.  Do not drink clear liquids: (After Midnight) Desps de medianoche.  Take these medicines the morning of surgery with A SIP OF WATER:  None but bring your inhaler with you   Do not wear jewelry, make-up or nail polish.  Do not wear lotions, powders, or perfumes. Do not wear deodorant.  Do not shave 48 hours prior to surgery.  Do not bring valuables to the hospital.  [facility] is not   responsible for any belongings or valuables brought to the hospital.  Contacts, dentures or bridgework may not be worn into surgery.  Leave suitcase in the car. After surgery it may be brought to your room.  For patients admitted to the hospital, checkout time is 11:00 AM the day of              discharge.      Please read over the following fact sheets that you were given:     Preparing for Surgery

## 2019-10-10 ENCOUNTER — Other Ambulatory Visit (HOSPITAL_COMMUNITY)
Admission: RE | Admit: 2019-10-10 | Discharge: 2019-10-10 | Disposition: A | Discharge status: Home / Self Care | Payer: BC Managed Care – PPO | Source: Ambulatory Visit | Attending: Obstetrics | Admitting: Obstetrics

## 2019-10-10 ENCOUNTER — Other Ambulatory Visit: Payer: Self-pay

## 2019-10-10 DIAGNOSIS — Z20822 Contact with and (suspected) exposure to covid-19: Secondary | ICD-10-CM | POA: Insufficient documentation

## 2019-10-10 DIAGNOSIS — Z01812 Encounter for preprocedural laboratory examination: Secondary | ICD-10-CM | POA: Insufficient documentation

## 2019-10-10 LAB — CBC
HCT: 39.6 % (ref 36.0–46.0)
Hemoglobin: 12.7 g/dL (ref 12.0–15.0)
MCH: 28 pg (ref 26.0–34.0)
MCHC: 32.1 g/dL (ref 30.0–36.0)
MCV: 87.4 fL (ref 80.0–100.0)
Platelets: 141 10*3/uL — ABNORMAL LOW (ref 150–400)
RBC: 4.53 MIL/uL (ref 3.87–5.11)
RDW: 14.6 % (ref 11.5–15.5)
WBC: 9.3 10*3/uL (ref 4.0–10.5)
nRBC: 0 % (ref 0.0–0.2)

## 2019-10-10 LAB — TYPE AND SCREEN
ABO/RH(D): O POS
Antibody Screen: NEGATIVE

## 2019-10-10 LAB — SARS CORONAVIRUS 2 (TAT 6-24 HRS): SARS Coronavirus 2: NEGATIVE

## 2019-10-10 LAB — RPR: RPR Ser Ql: NONREACTIVE

## 2019-10-11 NOTE — H&P (Signed)
Hannah Clark is a 24 y.o. G1P0 at 18w2dpresenting for PCS due to persistent breech and failed ECV attempt. Pt notes few contractions. Good fetal movement, No vaginal bleeding, not leaking fluid.  [place] [place] with plans for waterbirth - persistent breech, failed ECV - GBS pos, clinda resistant, PCN rash as a child, OK for Ancef - R-NI, plan MMR PP - h/o anxiety/ depression, consider restart Lexapro PP - AGA 36 wks   Prenatal Transfer Tool  Maternal Diabetes: No Genetic Screening: Normal Maternal Ultrasounds/Referrals: Normal Fetal Ultrasounds or other Referrals:  None Maternal Substance Abuse:  No Significant Maternal Medications:  None Significant Maternal Lab Results: Group B Strep positive     OB History    Gravida  1   Para      Term      Preterm      AB      Living        SAB      TAB      Ectopic      Multiple      Live Births             Past Medical History:  Diagnosis Date  . Anxiety   . Asthma   . Depression   . Headache    Past Surgical History:  Procedure Laterality Date  . CYSTECTOMY     leg  . TONSILLECTOMY    . WISDOM TOOTH EXTRACTION     Family History: family history includes Cirrhosis in her maternal grandmother; Dementia in her paternal grandmother; Depression in her maternal grandmother and mother; Diabetes in her maternal grandmother and mother; Diverticulitis in her father and mother; Fibromyalgia in her maternal grandmother and mother; Heart attack in her father and paternal grandfather; Heart disease in her father; Hypertension in her father, mother, and paternal grandmother; Migraines in her mother. Social History:  reports that she has never smoked. She has never used smokeless tobacco. She reports that she does not drink alcohol or use drugs.  Review of Systems - Negative except discomfort of pregnancy   Vitals:   10/12/19 0758  Pulse: 99  Resp: 20  Temp: 97.9 F  (36.6 C)    Physical Exam:  Gen: well appearing, no distress  Abd: gravid, NT, no RUQ pain,m PUPPS rash LE: trace edema, equal bilaterally, non-tender  CBC    Component Value Date/Time   WBC 9.3 10/10/2019 0859   RBC 4.53 10/10/2019 0859   HGB 12.7 10/10/2019 0859   HCT 39.6 10/10/2019 0859   PLT 141 (L) 10/10/2019 0859   MCV 87.4 10/10/2019 0859   MCH 28.0 10/10/2019 0859   MCHC 32.1 10/10/2019 0859   RDW 14.6 10/10/2019 0859     Prenatal labs: ABO, Rh: --/--/O POS (04/29 0859) Antibody: NEG (04/29 0859) Rubella: Nonimmune (03/23 0000) RPR: NON REACTIVE (04/29 0859)  HBsAg: Negative (03/23 0000)  HIV: Non-reactive (03/23 0000)  GBS:   positive  1 hr Glucola 111  Genetic screening declined [employer]   Assessment/Plan: 24y.o. G1P0 at 352w2d breech presentation, plan PCS, R/B d/w pt - Ancef - watch for PPD -consented for blood   KeAla Dach/06/2019 9:40 AM

## 2019-10-12 ENCOUNTER — Inpatient Hospital Stay (HOSPITAL_COMMUNITY): Payer: BC Managed Care – PPO | Admitting: Anesthesiology

## 2019-10-12 ENCOUNTER — Other Ambulatory Visit: Payer: Self-pay

## 2019-10-12 ENCOUNTER — Encounter (HOSPITAL_COMMUNITY)
Admission: RE | Disposition: A | Discharge status: Home / Self Care | Payer: Self-pay | Source: Home / Self Care | Attending: Obstetrics

## 2019-10-12 ENCOUNTER — Encounter (HOSPITAL_COMMUNITY): Payer: Self-pay | Admitting: Obstetrics

## 2019-10-12 ENCOUNTER — Inpatient Hospital Stay (HOSPITAL_COMMUNITY)
Admission: RE | Admit: 2019-10-12 | Discharge: 2019-10-15 | DRG: 788 | Disposition: A | Discharge status: Home / Self Care | Payer: BC Managed Care – PPO | Attending: Obstetrics | Admitting: Obstetrics

## 2019-10-12 DIAGNOSIS — O99824 Streptococcus B carrier state complicating childbirth: Secondary | ICD-10-CM | POA: Diagnosis present

## 2019-10-12 DIAGNOSIS — Z3A39 39 weeks gestation of pregnancy: Secondary | ICD-10-CM | POA: Diagnosis not present

## 2019-10-12 DIAGNOSIS — O321XX0 Maternal care for breech presentation, not applicable or unspecified: Principal | ICD-10-CM | POA: Diagnosis present

## 2019-10-12 DIAGNOSIS — O99344 Other mental disorders complicating childbirth: Secondary | ICD-10-CM | POA: Diagnosis present

## 2019-10-12 DIAGNOSIS — Z20822 Contact with and (suspected) exposure to covid-19: Secondary | ICD-10-CM | POA: Diagnosis present

## 2019-10-12 DIAGNOSIS — F329 Major depressive disorder, single episode, unspecified: Secondary | ICD-10-CM | POA: Diagnosis present

## 2019-10-12 DIAGNOSIS — F419 Anxiety disorder, unspecified: Secondary | ICD-10-CM | POA: Diagnosis present

## 2019-10-12 DIAGNOSIS — Z23 Encounter for immunization: Secondary | ICD-10-CM

## 2019-10-12 DIAGNOSIS — O2686 Pruritic urticarial papules and plaques of pregnancy (PUPPP): Secondary | ICD-10-CM | POA: Diagnosis present

## 2019-10-12 SURGERY — Surgical Case
Anesthesia: Spinal | Site: Abdomen | Wound class: Clean Contaminated

## 2019-10-12 MED ORDER — ACETAMINOPHEN 500 MG PO TABS
ORAL_TABLET | ORAL | Status: AC
  Filled 2019-10-12: qty 2

## 2019-10-12 MED ORDER — DIBUCAINE (PERIANAL) 1 % EX OINT: 1.0000 "application " | TOPICAL_OINTMENT | CUTANEOUS | Status: DC | PRN

## 2019-10-12 MED ORDER — NALBUPHINE HCL 10 MG/ML IJ SOLN: 5.0000 mg | Freq: Once | INTRAMUSCULAR | Status: DC | PRN

## 2019-10-12 MED ORDER — BUPIVACAINE IN DEXTROSE 0.75-8.25 % IT SOLN
INTRATHECAL | Status: DC | PRN
  Administered 2019-10-12: 1.4 mL via INTRATHECAL

## 2019-10-12 MED ORDER — STERILE WATER FOR IRRIGATION IR SOLN
Status: DC | PRN
  Administered 2019-10-12: 1000 mL

## 2019-10-12 MED ORDER — SODIUM CHLORIDE 0.9 % IR SOLN
Status: DC | PRN
  Administered 2019-10-12: 1000 mL

## 2019-10-12 MED ORDER — TETANUS-DIPHTH-ACELL PERTUSSIS 5-2.5-18.5 LF-MCG/0.5 IM SUSP: 0.5000 mL | Freq: Once | INTRAMUSCULAR | Status: DC

## 2019-10-12 MED ORDER — SIMETHICONE 80 MG PO CHEW: 80.0000 mg | CHEWABLE_TABLET | ORAL | Status: DC | PRN

## 2019-10-12 MED ORDER — OXYTOCIN 40 UNITS IN NORMAL SALINE INFUSION - SIMPLE MED: 2.5000 [IU]/h | INTRAVENOUS | Status: AC

## 2019-10-12 MED ORDER — CEFAZOLIN SODIUM-DEXTROSE 2-4 GM/100ML-% IV SOLN: 2.0000 g | INTRAVENOUS | Status: DC

## 2019-10-12 MED ORDER — ACETAMINOPHEN 500 MG PO TABS
1000.0000 mg | ORAL_TABLET | Freq: Four times a day (QID) | ORAL | Status: AC
  Administered 2019-10-12 – 2019-10-13 (×4): 1000 mg via ORAL
  Filled 2019-10-12 (×3): qty 2

## 2019-10-12 MED ORDER — CEFAZOLIN SODIUM-DEXTROSE 2-4 GM/100ML-% IV SOLN
INTRAVENOUS | Status: AC
  Filled 2019-10-12: qty 100

## 2019-10-12 MED ORDER — KETOROLAC TROMETHAMINE 30 MG/ML IJ SOLN: 30.0000 mg | Freq: Four times a day (QID) | INTRAMUSCULAR | Status: AC | PRN

## 2019-10-12 MED ORDER — NALOXONE HCL 0.4 MG/ML IJ SOLN: 0.4000 mg | INTRAMUSCULAR | Status: DC | PRN

## 2019-10-12 MED ORDER — FENTANYL CITRATE (PF) 100 MCG/2ML IJ SOLN
INTRAMUSCULAR | Status: DC | PRN
  Administered 2019-10-12: 15 ug via INTRATHECAL

## 2019-10-12 MED ORDER — LACTATED RINGERS IV SOLN: INTRAVENOUS | Status: DC

## 2019-10-12 MED ORDER — SCOPOLAMINE 1 MG/3DAYS TD PT72
1.0000 | MEDICATED_PATCH | Freq: Once | TRANSDERMAL | Status: AC
  Administered 2019-10-12: 1.5 mg via TRANSDERMAL

## 2019-10-12 MED ORDER — PHENYLEPHRINE HCL-NACL 20-0.9 MG/250ML-% IV SOLN
INTRAVENOUS | Status: DC | PRN
  Administered 2019-10-12: 60 ug/min via INTRAVENOUS
  Administered 2019-10-12: 30 ug/min via INTRAVENOUS

## 2019-10-12 MED ORDER — SOD CITRATE-CITRIC ACID 500-334 MG/5ML PO SOLN
30.0000 mL | Freq: Once | ORAL | Status: AC
  Administered 2019-10-12: 30 mL via ORAL

## 2019-10-12 MED ORDER — ONDANSETRON HCL 4 MG/2ML IJ SOLN: 4.0000 mg | Freq: Three times a day (TID) | INTRAMUSCULAR | Status: DC | PRN

## 2019-10-12 MED ORDER — MORPHINE SULFATE (PF) 0.5 MG/ML IJ SOLN
INTRAMUSCULAR | Status: AC
  Filled 2019-10-12: qty 10

## 2019-10-12 MED ORDER — NALBUPHINE HCL 10 MG/ML IJ SOLN: 5.0000 mg | INTRAMUSCULAR | Status: DC | PRN

## 2019-10-12 MED ORDER — DEXAMETHASONE SODIUM PHOSPHATE 4 MG/ML IJ SOLN
INTRAMUSCULAR | Status: DC | PRN
  Administered 2019-10-12: 4 mg via INTRAVENOUS

## 2019-10-12 MED ORDER — SOD CITRATE-CITRIC ACID 500-334 MG/5ML PO SOLN
ORAL | Status: AC
  Filled 2019-10-12: qty 30

## 2019-10-12 MED ORDER — SENNOSIDES-DOCUSATE SODIUM 8.6-50 MG PO TABS
2.0000 | ORAL_TABLET | ORAL | Status: DC
  Administered 2019-10-12 – 2019-10-14 (×3): 2 via ORAL
  Filled 2019-10-12 (×3): qty 2

## 2019-10-12 MED ORDER — DEXAMETHASONE SODIUM PHOSPHATE 4 MG/ML IJ SOLN
INTRAMUSCULAR | Status: AC
  Filled 2019-10-12: qty 1

## 2019-10-12 MED ORDER — WITCH HAZEL-GLYCERIN EX PADS: 1.0000 "application " | MEDICATED_PAD | CUTANEOUS | Status: DC | PRN

## 2019-10-12 MED ORDER — DIPHENHYDRAMINE HCL 25 MG PO CAPS
25.0000 mg | ORAL_CAPSULE | Freq: Four times a day (QID) | ORAL | Status: DC | PRN
  Administered 2019-10-15: 25 mg via ORAL

## 2019-10-12 MED ORDER — SODIUM CHLORIDE 0.9% FLUSH: 3.0000 mL | INTRAVENOUS | Status: DC | PRN

## 2019-10-12 MED ORDER — SCOPOLAMINE 1 MG/3DAYS TD PT72
MEDICATED_PATCH | TRANSDERMAL | Status: AC
  Filled 2019-10-12: qty 1

## 2019-10-12 MED ORDER — MORPHINE SULFATE (PF) 0.5 MG/ML IJ SOLN
INTRAMUSCULAR | Status: DC | PRN
  Administered 2019-10-12: 150 ug via INTRATHECAL

## 2019-10-12 MED ORDER — SIMETHICONE 80 MG PO CHEW
80.0000 mg | CHEWABLE_TABLET | ORAL | Status: DC
  Administered 2019-10-12 – 2019-10-14 (×3): 80 mg via ORAL
  Filled 2019-10-12 (×3): qty 1

## 2019-10-12 MED ORDER — KETOROLAC TROMETHAMINE 30 MG/ML IJ SOLN
INTRAMUSCULAR | Status: AC
  Filled 2019-10-12: qty 1

## 2019-10-12 MED ORDER — FENTANYL CITRATE (PF) 100 MCG/2ML IJ SOLN
INTRAMUSCULAR | Status: AC
  Filled 2019-10-12: qty 2

## 2019-10-12 MED ORDER — OXYTOCIN 10 UNIT/ML IJ SOLN
INTRAMUSCULAR | Status: DC | PRN
  Administered 2019-10-12: 40 [IU]

## 2019-10-12 MED ORDER — MEPERIDINE HCL 25 MG/ML IJ SOLN: 6.2500 mg | INTRAMUSCULAR | Status: DC | PRN

## 2019-10-12 MED ORDER — IBUPROFEN 800 MG PO TABS
800.0000 mg | ORAL_TABLET | Freq: Three times a day (TID) | ORAL | Status: AC
  Administered 2019-10-13 – 2019-10-15 (×8): 800 mg via ORAL
  Filled 2019-10-12 (×5): qty 1
  Filled 2019-10-12: qty 4
  Filled 2019-10-12 (×2): qty 1

## 2019-10-12 MED ORDER — CEFAZOLIN SODIUM-DEXTROSE 2-3 GM-%(50ML) IV SOLR
INTRAVENOUS | Status: DC | PRN
  Administered 2019-10-12: 2 g via INTRAVENOUS

## 2019-10-12 MED ORDER — OXYCODONE HCL 5 MG PO TABS
5.0000 mg | ORAL_TABLET | ORAL | Status: DC | PRN
  Administered 2019-10-13 – 2019-10-15 (×6): 5 mg via ORAL
  Administered 2019-10-15: 10 mg via ORAL
  Filled 2019-10-12 (×5): qty 1
  Filled 2019-10-12 (×2): qty 2

## 2019-10-12 MED ORDER — NALOXONE HCL 4 MG/10ML IJ SOLN
1.0000 ug/kg/h | INTRAVENOUS | Status: DC | PRN
  Filled 2019-10-12: qty 5

## 2019-10-12 MED ORDER — DIPHENHYDRAMINE HCL 50 MG/ML IJ SOLN: 12.5000 mg | INTRAMUSCULAR | Status: DC | PRN

## 2019-10-12 MED ORDER — LACTATED RINGERS IV SOLN: INTRAVENOUS | Status: DC | PRN

## 2019-10-12 MED ORDER — PRENATAL MULTIVITAMIN CH
1.0000 | ORAL_TABLET | Freq: Every day | ORAL | Status: DC
  Administered 2019-10-13 – 2019-10-15 (×3): 1 via ORAL
  Filled 2019-10-12 (×3): qty 1

## 2019-10-12 MED ORDER — OXYTOCIN 40 UNITS IN NORMAL SALINE INFUSION - SIMPLE MED
INTRAVENOUS | Status: AC
  Filled 2019-10-12: qty 1000

## 2019-10-12 MED ORDER — ONDANSETRON HCL 4 MG/2ML IJ SOLN
INTRAMUSCULAR | Status: DC | PRN
  Administered 2019-10-12: 4 mg via INTRAVENOUS

## 2019-10-12 MED ORDER — SODIUM CHLORIDE 0.9 % IV SOLN: INTRAVENOUS | Status: DC | PRN

## 2019-10-12 MED ORDER — SIMETHICONE 80 MG PO CHEW
80.0000 mg | CHEWABLE_TABLET | Freq: Three times a day (TID) | ORAL | Status: DC
  Administered 2019-10-12 – 2019-10-15 (×9): 80 mg via ORAL
  Filled 2019-10-12 (×9): qty 1

## 2019-10-12 MED ORDER — ONDANSETRON HCL 4 MG/2ML IJ SOLN
INTRAMUSCULAR | Status: AC
  Filled 2019-10-12: qty 2

## 2019-10-12 MED ORDER — COCONUT OIL OIL: 1.0000 "application " | TOPICAL_OIL | Status: DC | PRN

## 2019-10-12 MED ORDER — ZOLPIDEM TARTRATE 5 MG PO TABS: 5.0000 mg | ORAL_TABLET | Freq: Every evening | ORAL | Status: DC | PRN

## 2019-10-12 MED ORDER — KETOROLAC TROMETHAMINE 30 MG/ML IJ SOLN
30.0000 mg | Freq: Four times a day (QID) | INTRAMUSCULAR | Status: AC | PRN
  Administered 2019-10-12 – 2019-10-13 (×3): 30 mg via INTRAVENOUS
  Filled 2019-10-12 (×2): qty 1

## 2019-10-12 MED ORDER — MENTHOL 3 MG MT LOZG: 1.0000 | LOZENGE | OROMUCOSAL | Status: DC | PRN

## 2019-10-12 MED ORDER — DIPHENHYDRAMINE HCL 25 MG PO CAPS
25.0000 mg | ORAL_CAPSULE | ORAL | Status: DC | PRN
  Filled 2019-10-12: qty 1

## 2019-10-12 MED ORDER — PHENYLEPHRINE HCL-NACL 20-0.9 MG/250ML-% IV SOLN
INTRAVENOUS | Status: AC
  Filled 2019-10-12: qty 250

## 2019-10-12 SURGICAL SUPPLY — 30 items
APL SKNCLS STERI-STRIP NONHPOA (GAUZE/BANDAGES/DRESSINGS) ×1
BENZOIN TINCTURE PRP APPL 2/3 (GAUZE/BANDAGES/DRESSINGS) ×2 IMPLANT
CHLORAPREP W/TINT 26ML (MISCELLANEOUS) ×3 IMPLANT
CLAMP CORD UMBIL (MISCELLANEOUS) ×2 IMPLANT
CLOSURE WOUND 1/2 X4 (GAUZE/BANDAGES/DRESSINGS) ×1
CLOTH BEACON ORANGE TIMEOUT ST (SAFETY) ×3 IMPLANT
DRSG OPSITE POSTOP 4X10 (GAUZE/BANDAGES/DRESSINGS) ×3 IMPLANT
ELECT REM PT RETURN 9FT ADLT (ELECTROSURGICAL) ×3
ELECTRODE REM PT RTRN 9FT ADLT (ELECTROSURGICAL) ×1 IMPLANT
GLOVE BIO SURGEON STRL SZ 6.5 (GLOVE) ×2 IMPLANT
GLOVE BIO SURGEONS STRL SZ 6.5 (GLOVE) ×1
GLOVE BIOGEL PI IND STRL 7.0 (GLOVE) ×2 IMPLANT
GLOVE BIOGEL PI INDICATOR 7.0 (GLOVE) ×4
GOWN STRL REUS W/TWL LRG LVL3 (GOWN DISPOSABLE) ×6 IMPLANT
NS IRRIG 1000ML POUR BTL (IV SOLUTION) ×3 IMPLANT
PACK C SECTION WH (CUSTOM PROCEDURE TRAY) ×3 IMPLANT
PAD OB MATERNITY 4.3X12.25 (PERSONAL CARE ITEMS) ×3 IMPLANT
PENCIL SMOKE EVAC W/HOLSTER (ELECTROSURGICAL) ×3 IMPLANT
STRIP CLOSURE SKIN 1/2X4 (GAUZE/BANDAGES/DRESSINGS) ×1 IMPLANT
SUT MON AB 4-0 PS1 27 (SUTURE) ×3 IMPLANT
SUT PLAIN 2 0 XLH (SUTURE) ×2 IMPLANT
SUT VIC AB 0 CT1 36 (SUTURE) ×6 IMPLANT
SUT VIC AB 0 CTX 36 (SUTURE) ×6
SUT VIC AB 0 CTX36XBRD ANBCTRL (SUTURE) ×2 IMPLANT
SUT VIC AB 2-0 CT1 27 (SUTURE) ×3
SUT VIC AB 2-0 CT1 TAPERPNT 27 (SUTURE) ×1 IMPLANT
SUT VIC AB 4-0 KS 27 (SUTURE) ×2 IMPLANT
TOWEL OR 17X24 6PK STRL BLUE (TOWEL DISPOSABLE) ×3 IMPLANT
TRAY FOLEY W/BAG SLVR 14FR LF (SET/KITS/TRAYS/PACK) ×2 IMPLANT
WATER STERILE IRR 1000ML POUR (IV SOLUTION) ×3 IMPLANT

## 2019-10-12 NOTE — Transfer of Care (Signed)
Immediate Anesthesia Transfer of Care Note  Patient: Hannah Clark  Procedure(s) Performed: Primary CESAREAN SECTION (N/A Abdomen)  Patient Location: PACU  Anesthesia Type:Spinal  Level of Consciousness: awake, alert , oriented and patient cooperative  Airway & Oxygen Therapy: Patient Spontanous Breathing  Post-op Assessment: Report given to RN and Post -op Vital signs reviewed and stable  Post vital signs: Reviewed and stable  Last Vitals:  Vitals Value Taken Time  BP 110/84 10/12/19 1109  Temp    Pulse 93 10/12/19 1112  Resp 19 10/12/19 1112  SpO2 100 % 10/12/19 1112  Vitals shown include unvalidated device data.  Last Pain:  Vitals:   10/12/19 0758  TempSrc: Oral         Complications: No apparent anesthesia complications

## 2019-10-12 NOTE — Lactation Note (Signed)
This note was copied from a Hannah's chart. Lactation Consultation Note  Patient Name: Hannah Clark [id number] Date: 10/12/2019  Mom with hx of anxiety/depression.  Hannah Hannah Hannah Clark born via csection due to frank breech.  Hannah Clark now 71 hours old.  Mom reports she has been able to breastfeed him on the left breast three times but has not been able to breastfeed him on the right he will not latch. Infant cuing.Started crying.  Questionable tight frenulum. Offered to assist with feeding. Assist with trying to latch him on the right.  Mom with visible edema and pitting and hard areas on right breast.  Nipple flat. Assisted with massage and hand expression and prepuming.  Nipple came out slightly and he is able to latch but mom reports pain. Attempted a few times and could not get mom comfortable.  Mom with visible bruising on left nipple and areola. And left nipple sore she reports  After a few attempts used 24 mm nipple shield on right breast and he latched and good breastfeeding observed and mom reported comfort.   Left mom and Hannah breastfeeding.  Urged massage prepump and hand expression right breast.  Gave mom shells to wear with instructions not to wear while sleeping. Reviewed Understanding Mother and Hannah and reviewed and gave [facility] breastfeeding consultation services handouts.  Urged mom to call lactation as needed.   Maternal Data    Feeding Feeding Type: Breast Fed  [facility] Score                   Interventions    Lactation Tools Discussed/Used     Consult Status      Jaziah Kwasnik Michaelle Copas 10/12/2019, 11:31 PM

## 2019-10-12 NOTE — Anesthesia Postprocedure Evaluation (Signed)
Anesthesia Post Note  Patient: Hannah Clark  Procedure(s) Performed: Primary CESAREAN SECTION (N/A Abdomen)     Patient location during evaluation: PACU Anesthesia Type: Spinal Level of consciousness: awake Pain management: pain level controlled Respiratory status: spontaneous breathing Cardiovascular status: stable Postop Assessment: no headache, no backache, spinal receding, patient able to bend at knees and no apparent nausea or vomiting Anesthetic complications: no    Last Vitals:  Vitals:   10/12/19 1215 10/12/19 1245  BP: 99/68 113/66  Pulse: 85 81  Resp: 13 16  Temp:  36.6 C  SpO2: 100% 100%    Last Pain:  Vitals:   10/12/19 1245  TempSrc: Oral  PainSc:    Pain Goal:    LLE Motor Response: Purposeful movement (10/12/19 1215)   RLE Motor Response: Purposeful movement (10/12/19 1215)       Epidural/Spinal Function Cutaneous sensation: Able to Wiggle Toes (10/12/19 1230), Patient able to flex knees: Yes (10/12/19 1230), Patient able to lift hips off bed: Yes (10/12/19 1230), Back pain beyond tenderness at insertion site: No (10/12/19 1230), Progressively worsening motor and/or sensory loss: No (10/12/19 1230), Bowel and/or bladder incontinence post epidural: No (10/12/19 1230)  Caren Macadam

## 2019-10-12 NOTE — Brief Op Note (Signed)
10/12/2019  11:10 AM  PATIENT:  Hannah Clark  24 y.o. female  PRE-OPERATIVE DIAGNOSIS:  Breech Presentation, Failed Version  POST-OPERATIVE DIAGNOSIS:  Breech Presentation, Failed Version  PROCEDURE:  Procedure(s) with comments: Primary CESAREAN SECTION (N/A) - EDD: 10/17/19  Primary low transverse cesarean section with 2 layer closure  SURGEON:  Surgeon(s) and Role:    Noland Fordyce, MD - Primary  PHYSICIAN ASSISTANT:   ASSISTANTS: Arlan Organ, CNM  ANESTHESIA:   spinal  EBL: Per OR notes, less than 300 cc  BLOOD ADMINISTERED:none  DRAINS: Urinary Catheter (Foley)   LOCAL MEDICATIONS USED:  NONE  SPECIMEN:  Source of Specimen:  Placenta to labor and delivery for disposal  DISPOSITION OF SPECIMEN:  N/A  COUNTS:  YES  TOURNIQUET:  * No tourniquets in log *  DICTATION: .Note written in EPIC  PLAN OF CARE: Admit to inpatient   PATIENT DISPOSITION:  PACU - hemodynamically stable.   Delay start of Pharmacological VTE agent (>24hrs) due to surgical blood loss or risk of bleeding: yes

## 2019-10-12 NOTE — Anesthesia Preprocedure Evaluation (Signed)
Anesthesia Evaluation  Patient identified by MRN, date of birth, ID band Patient awake    Reviewed: Allergy & Precautions, H&P , NPO status , Patient's Chart, lab work & pertinent test results  Airway Mallampati: II  TM Distance: >3 FB Neck ROM: full    Dental no notable dental hx. (+) Teeth Intact   Pulmonary    Pulmonary exam normal breath sounds clear to auscultation       Cardiovascular negative cardio ROS Normal cardiovascular exam Rhythm:regular Rate:Normal     Neuro/Psych PSYCHIATRIC DISORDERS Anxiety Depression    GI/Hepatic negative GI ROS, Neg liver ROS,   Endo/Other  negative endocrine ROS  Renal/GU negative Renal ROS  negative genitourinary   Musculoskeletal negative musculoskeletal ROS (+)   Abdominal (+) + obese,   Peds  Hematology negative hematology ROS (+)   Anesthesia Other Findings   Reproductive/Obstetrics (+) Pregnancy                             Anesthesia Physical Anesthesia Plan  ASA: II  Anesthesia Plan: Spinal   Post-op Pain Management:    Induction:   PONV Risk Score and Plan: 3 and Ondansetron, Dexamethasone and Scopolamine patch - Pre-op  Airway Management Planned: Nasal Cannula, Natural Airway and Simple Face Mask  Additional Equipment: None  Intra-op Plan:   Post-operative Plan:   Informed Consent: I have reviewed the patients History and Physical, chart, labs and discussed the procedure including the risks, benefits and alternatives for the proposed anesthesia with the patient or authorized representative who has indicated his/her understanding and acceptance.       Plan Discussed with: CRNA  Anesthesia Plan Comments:         Anesthesia Quick Evaluation

## 2019-10-12 NOTE — Op Note (Signed)
10/12/2019  11:10 AM  PATIENT:  Hannah Clark  24 y.o. female  PRE-OPERATIVE DIAGNOSIS:  Breech Presentation, Failed Version  POST-OPERATIVE DIAGNOSIS:  Breech Presentation, Failed Version  PROCEDURE:  Procedure(s) with comments: Primary CESAREAN SECTION (N/A) - EDD: 10/17/19  Primary low transverse cesarean section with 2 layer closure  SURGEON:  Surgeon(s) and Role:    Aloha Gell, MD - Primary  PHYSICIAN ASSISTANT:   ASSISTANTS: Derrell Lolling, CNM  ANESTHESIA:   spinal  EBL: Per OR notes, less than 300 cc  BLOOD ADMINISTERED:none  DRAINS: Urinary Catheter (Foley)   LOCAL MEDICATIONS USED:  NONE  SPECIMEN:  Source of Specimen:  Placenta to labor and delivery for disposal  DISPOSITION OF SPECIMEN:  N/A  COUNTS:  YES  TOURNIQUET:  * No tourniquets in log *  DICTATION: .Note written in EPIC  PLAN OF CARE: Admit to inpatient   PATIENT DISPOSITION:  PACU - hemodynamically stable.   Delay start of Pharmacological VTE agent (>24hrs) due to surgical blood loss or risk of bleeding: yes     Findings:  @BABYSEXEBC @ infant,  APGAR (1 MIN):   APGAR (5 MINS):   APGAR (10 MINS):   Normal uterus, tubes and ovaries, normal placenta. 3VC, clear amniotic fluid, complete breech presentation  EBL: Less than 300 cc Antibiotics:   2g Ancef Complications: none  Indications: This is a 24 y.o. year-old, G1 at [redacted]w[redacted]d admitted for primary cesarean section for persistent breech presentation despite failed attempt at external cephalic version. Risks benefits and alternatives of the procedure were discussed with the patient who agreed to proceed  Procedure:  After informed consent was obtained the patient was taken to the operating room where spinal anesthesia was initiated.  She was prepped and draped in the normal sterile fashion in dorsal supine position with a leftward tilt.  A foley catheter was in place.  A Pfannenstiel skin incision was made 2 cm above the pubic symphysis in  the midline with the scalpel.  Dissection was carried down with the Bovie cautery until the fascia was reached. The fascia was incised in the midline. The incision was extended laterally with the Mayo scissors. The inferior aspect of the fascial incision was grasped with the Coker clamps, elevated up and the underlying rectus muscles were dissected off sharply. The superior aspect of the fascial incision was grasped with the Coker clamps elevated up and the underlying rectus muscles were dissected off sharply.  The peritoneum was entered bluntly. The peritoneal incision was extended superiorly and inferiorly with good visualization of the bladder. The bladder blade was inserted and palpation was done to assess the fetal position and the location of the uterine vessels.  A bladder flap was created sharply.  the lower segment of the uterus was incised sharply with the scalpel and extended  bluntly in the cephalo-caudal fashion.  A bandage scissor was used to create about 1 cm extra space cephalad from the left angle.  The infant  sacrum was grasped, brought to the incision, legs followed immediately in flexion, left then right shoulder delivered with rotation of the head was delivered in flexion. The nose and mouth were bulb suctioned. The cord was clamped and cut after 1 minute delay. The infant was handed off to the waiting pediatrician. The placenta was expressed. The uterus was exteriorized.  The extension made with the bandage scissors was repaired with running locked 0 Vicryl.  The uterus was cleared of all clots and debris. The uterine incision was repaired  with 0 Vicryl in a running locked fashion.  A second layer of the same suture was used in an imbricating fashion to obtain excellent hemostasis.  The extension was incorporated into the second layer.  the uterus was then returned to the abdomen, the gutters were cleared of all clots and debris. The uterine incision was reinspected and found to be hemostatic.  The peritoneum was grasped and closed with 2-0 Vicryl in a running fashion. The cut muscle edges and the underside of the fascia were inspected and found to be hemostatic. The fascia was closed with 0 Vicryl in a single layer . The subcutaneous tissue was irrigated. Scarpa's layer was closed with a 2-0 plain gut suture. The skin was closed with a 4-0 Monocryl in a single layer. The patient tolerated the procedure well. Sponge lap and needle counts were correct x3 and patient was taken to the recovery room in a stable condition.  Lendon Colonel 10/12/2019 11:11 AM

## 2019-10-12 NOTE — Anesthesia Procedure Notes (Signed)
Spinal  Patient location during procedure: OR Start time: 10/12/2019 9:57 AM End time: 10/12/2019 10:00 AM Staffing Performed: anesthesiologist  Anesthesiologist: Leilani Able, MD Preanesthetic Checklist Completed: patient identified, IV checked, site marked, risks and benefits discussed, surgical consent, monitors and equipment checked, pre-op evaluation and timeout performed Spinal Block Patient position: sitting Prep: DuraPrep and site prepped and draped Patient monitoring: continuous pulse ox and blood pressure Approach: midline Location: L3-4 Injection technique: single-shot Needle Needle type: Pencan  Needle gauge: 24 G Needle length: 10 cm Needle insertion depth: 6 cm Assessment Sensory level: T4

## 2019-10-13 LAB — CBC
HCT: 37.3 % (ref 36.0–46.0)
Hemoglobin: 12.2 g/dL (ref 12.0–15.0)
MCH: 28.6 pg (ref 26.0–34.0)
MCHC: 32.7 g/dL (ref 30.0–36.0)
MCV: 87.4 fL (ref 80.0–100.0)
Platelets: 156 10*3/uL (ref 150–400)
RBC: 4.27 MIL/uL (ref 3.87–5.11)
RDW: 14.8 % (ref 11.5–15.5)
WBC: 13.5 10*3/uL — ABNORMAL HIGH (ref 4.0–10.5)
nRBC: 0 % (ref 0.0–0.2)

## 2019-10-13 MED ORDER — LORATADINE 10 MG PO TABS
10.0000 mg | ORAL_TABLET | Freq: Every day | ORAL | Status: DC
  Administered 2019-10-13 – 2019-10-15 (×3): 10 mg via ORAL
  Filled 2019-10-13 (×3): qty 1

## 2019-10-13 NOTE — Progress Notes (Signed)
POD# 1  S: Pt notes pain controlled w/ po meds, minimal lochia, nl void, out of bed w/o dizziness or chest pain, tol reg po, no flatus.   Vitals:   10/12/19 1245 10/12/19 1337 10/12/19 2315 10/13/19 0600  BP: 113/66 99/62 (!) 100/58 115/64  Pulse: 81 95 (!) 57 61  Resp: 16 18 18 18   Temp: 97.9 F (36.6 C) 98.4 F (36.9 C) 98 F (36.7 C) 98.6 F (37 C)  TempSrc: Oral Oral Oral Oral  SpO2: 100% 99% 99% 100%  Weight:      Height:        Gen: well appearing CV: RRR Pulm: CTAB Abd: soft, ND, approp tender, fundus below umbilicus, NT Inc: C/D/I LE: tr edema, NT  CBC    Component Value Date/Time   WBC 13.5 (H) 10/13/2019 0939   RBC 4.27 10/13/2019 0939   HGB 12.2 10/13/2019 0939   HCT 37.3 10/13/2019 0939   PLT 156 10/13/2019 0939   MCV 87.4 10/13/2019 0939   MCH 28.6 10/13/2019 0939   MCHC 32.7 10/13/2019 0939   RDW 14.8 10/13/2019 0939    A/P: POD#  1 s/p PCS for breech - post-op. Doing well.   12/13/2019 10/13/2019 12:17 PM

## 2019-10-13 NOTE — Lactation Note (Signed)
This note was copied from a baby's chart. Lactation Consultation Note  Patient Name: Hannah Clark FTDDU'K Date: 10/13/2019 Reason for consult: Follow-up assessment;Term;Infant weight loss P1, 37 term female infant, -5% weight loss/ Per parents, infant had 9 voids and 6 stools since birth. Per mom, she feels infant is starting to improve with latch, sucking better she feels tug but not pain with latch. Mom has hand pump she pre-pumps breast prior to latching infant due to having flat nipples. LC did not observe latch, per mom, she breastfed infant 1 hour prior to [facility] entering the room tonight. LC notice mom had suck bites on right breast where infant had came off breast and was sucking on breast but not nipple from earlier latches. Mom knows to ask for RN or LC for latch assistance if needed.  LC reviewed hand expression and mom expressed few drops of colostrum in bullet that she will give to infant after latching infant at breast for next feeding. Mom will continue to breastfeed infant according to hunger cues, on demand, 8-12+ times within 24 hours,   Maternal Data    Feeding Feeding Type: Breast Fed  LATCH Score                   Interventions Interventions: Hand express;Pre-pump if needed;Hand pump;Breast compression;Comfort gels  Lactation Tools Discussed/Used     Consult Status Consult Status: Follow-up Date: 10/14/19 Follow-up type: In-patient    Danelle Earthly 10/13/2019, 11:43 PM

## 2019-10-14 MED ORDER — ACETAMINOPHEN 500 MG PO TABS
1000.0000 mg | ORAL_TABLET | Freq: Four times a day (QID) | ORAL | Status: DC | PRN
  Administered 2019-10-14 – 2019-10-15 (×3): 1000 mg via ORAL
  Filled 2019-10-14 (×3): qty 2

## 2019-10-14 MED ORDER — MEASLES, MUMPS & RUBELLA VAC IJ SOLR
0.5000 mL | Freq: Once | INTRAMUSCULAR | Status: AC
  Administered 2019-10-15: 0.5 mL via SUBCUTANEOUS
  Filled 2019-10-14: qty 0.5

## 2019-10-14 NOTE — Progress Notes (Signed)
POSTOPERATIVE DAY # 2 S/P Primary LTCS for breech presentation, baby boy "Glennon Mac"   S:         Reports feeling okay, reports incisional pain with moving, but controlled with Motrin and Oxy IR             Tolerating po intake / no nausea / no vomiting / + flatus / no BM  Denies dizziness, SOB, or CP since yesterday             Bleeding is light             Up ad lib / ambulatory/ voiding QS without difficulty  Newborn breast feeding - mom reports 10% weight loss, lactation in room working on setting up DEBP. Mom does not want early d/c to continue to work on feeding  / Circumcision - planning prior to d/c   O:  VS: BP 110/74 (BP Location: Left Arm)   Pulse 74   Temp 97.8 F (36.6 C) (Oral)   Resp 18   Ht '5\' 3"'$  (1.6 m)   Wt 83.5 kg   SpO2 100%   Breastfeeding Unknown   BMI 32.59 kg/m    LABS:               Recent Labs    10/13/19 0939  WBC 13.5*  HGB 12.2  PLT 156               Bloodtype: --/--/O POS (04/29 0859)  Rubella: Nonimmune (03/23 0000)                                             I&O: Intake/Output      05/02 0701 - 05/03 0700 05/03 0701 - 05/04 0700   P.O.     I.V. (mL/kg)     Total Intake(mL/kg)     Urine (mL/kg/hr) 1000 (0.5)    Blood     Total Output 1000    Net -1000         Urine Occurrence 1 x      Flu and Tdap UTD             Physical Exam:             Alert and Oriented X3  Lungs: Clear and unlabored  Heart: regular rate and rhythm / no murmurs  Abdomen: soft, non-tender, mild gaseous distention, active bowel sounds in all quadrants; PUPPS rash noted in striae              Fundus: firm, non-tender, U-2             Dressing: honeycomb dressing with steri-strips - scant brown d/c, no active drainage, otherwise dry and intact              Incision:  approximated with sutures / no erythema / no ecchymosis / no drainage  Perineum: intact  Lochia: scant rubra on pad  Extremities: no edema, no calf pain or tenderness, SCDs in place  A/P:    POD  # 2 S/P Primary LTCS for breech            Rubella non-immune   - offer MMR prior to d/c  Routine postoperative care   Add Tylenol '1000mg'$  every 6 hrs PRN for breakthrough post-op pain              Encouraged  incentive spirometer use   Ambulation and warm liquids to promote bowel motility  Continue lactation support  Circ prior to d/c  Anticipate d/c home tomorrow   Lars Pinks, MSN, CNM [facility]

## 2019-10-14 NOTE — Progress Notes (Signed)
[name] was referred for history of depression/anxiety. * Referral screened out by Clinical Social Worker because none of the following criteria appear to apply: ~ History of anxiety/depression during this pregnancy, or of post-partum depression following prior delivery. ~ Diagnosis of anxiety and/or depression within last 3 years. Per further chart review, it appears that [name] was diagnosed with  depression/anxiety in 2017.  OR * [name]'s symptoms currently being treated with medication and/or therapy.   Please contact the Clinical Social Worker if needs arise, by [facility] request, or if [name] scores greater than 9/yes to question 10 on Edinburgh Postpartum Depression Screen.    Hannah Clark, MSW, LCSW [facility] [phone]

## 2019-10-14 NOTE — Lactation Note (Signed)
This note was copied from a baby's chart. Lactation Consultation Note  Patient Name: Hannah Clark MVEHM'C Date: 10/14/2019 Reason for consult: Follow-up assessment;Infant weight loss Mom teary eyed.  She pumped 4 mls which was finger/syringe fed to baby.  Baby chewing on finger.  Reassured mom and support given.  Parents choose to use donor milk.  Plan is to breastfeed with cues, post pump x 15 minutes and supplement with expressed/donor milk using slow flow nipple per volume guidelines.  Report given to RN.  Maternal Data    Feeding Feeding Type: Breast Milk  LATCH Score Latch: Grasps breast easily, tongue down, lips flanged, rhythmical sucking.  Audible Swallowing: A few with stimulation  Type of Nipple: Everted at rest and after stimulation  Comfort (Breast/Nipple): Soft / non-tender  Hold (Positioning): Assistance needed to correctly position infant at breast and maintain latch.  LATCH Score: 8  Interventions Interventions: Breast feeding basics reviewed;Position options  Lactation Tools Discussed/Used Tools: Shells(ns ) Nipple shield size: 24 Pump Review: Setup, frequency, and cleaning;Milk Storage Initiated by:: Lmoulden Date initiated:: 10/14/19   Consult Status Consult Status: Follow-up Date: 10/15/19 Follow-up type: In-patient    Huston Foley 10/14/2019, 10:44 AM

## 2019-10-15 MED ORDER — HYDROCORTISONE 1 % EX OINT
TOPICAL_OINTMENT | Freq: Two times a day (BID) | CUTANEOUS | Status: DC
  Filled 2019-10-15: qty 28

## 2019-10-15 MED ORDER — FLUTICASONE PROPIONATE 0.005 % EX OINT
TOPICAL_OINTMENT | Freq: Two times a day (BID) | CUTANEOUS | Status: DC
  Filled 2019-10-15: qty 30

## 2019-10-15 MED ORDER — SIMETHICONE 80 MG PO CHEW: 80.0000 mg | CHEWABLE_TABLET | ORAL | 0 refills | Status: DC | PRN

## 2019-10-15 MED ORDER — ESCITALOPRAM OXALATE 10 MG PO TABS: 10.0000 mg | ORAL_TABLET | Freq: Every day | ORAL | 0 refills | Status: DC

## 2019-10-15 MED ORDER — FLUTICASONE PROPIONATE 0.005 % EX OINT: TOPICAL_OINTMENT | Freq: Two times a day (BID) | CUTANEOUS | 0 refills | Status: DC

## 2019-10-15 MED ORDER — ACETAMINOPHEN 500 MG PO TABS: 1000.0000 mg | ORAL_TABLET | Freq: Four times a day (QID) | ORAL | 0 refills | Status: DC | PRN

## 2019-10-15 MED ORDER — ESCITALOPRAM OXALATE 10 MG PO TABS
10.0000 mg | ORAL_TABLET | Freq: Every day | ORAL | Status: DC
  Administered 2019-10-15: 10 mg via ORAL
  Filled 2019-10-15: qty 1

## 2019-10-15 MED ORDER — SENNOSIDES-DOCUSATE SODIUM 8.6-50 MG PO TABS: 2.0000 | ORAL_TABLET | ORAL | Status: DC

## 2019-10-15 MED ORDER — IBUPROFEN 800 MG PO TABS: 800.0000 mg | ORAL_TABLET | Freq: Three times a day (TID) | ORAL | 0 refills | Status: DC

## 2019-10-15 MED ORDER — OXYCODONE HCL 5 MG PO TABS: 5.0000 mg | ORAL_TABLET | ORAL | 0 refills | Status: DC | PRN

## 2019-10-15 MED ORDER — ONDANSETRON HCL 4 MG PO TABS
8.0000 mg | ORAL_TABLET | Freq: Once | ORAL | Status: AC
  Administered 2019-10-15: 8 mg via ORAL
  Filled 2019-10-15: qty 2

## 2019-10-15 NOTE — Discharge Summary (Signed)
OB Discharge Summary  Patient Name: Hannah Clark DOB: 04/22/1996 MRN: [id number]  Date of admission: 10/12/2019 Delivering provider: Aloha Gell   Admitting diagnosis: Breech presentation [O32.1XX0] Intrauterine pregnancy: [redacted]w[redacted]d    Secondary diagnosis: Patient Active Problem List   Diagnosis Date Noted  . Postpartum care following cesarean delivery (5/1) 10/14/2019  . Breech presentation 10/12/2019  . Chronic migraine without aura without status migrainosus, not intractable 04/23/2016   Additional problems: Hx anxiety/depression, on Lexapro prior to pregnancy   Date of discharge: 10/15/2019   Discharge diagnosis: Principal Problem:   Postpartum care following cesarean delivery (5/1) Active Problems:   Breech presentation                                                              Post partum procedures:MMR vaccine  Pain control: Spinal  Complications: None  Hospital course:  Sceduled C/S   24y.o. yo G1P1001 at 372w2das admitted to the hospital 10/12/2019 for scheduled cesarean section with the following indication:Malpresentation.  Membrane Rupture Time/Date: 10:26 AM ,10/12/2019   Patient delivered a Viable infant.10/12/2019  Details of operation can be found in separate operative note.  Pateint had an uncomplicated postpartum course.  She is ambulating, tolerating a regular diet, passing flatus, and urinating well. PUPPP rash on abdomen very itchy and benadryl not effective, will Rx fluticasone ointment BID x 2 weeks. Also history of anxiety/depression on SSRI prior to pregnancy, will restart Lexapro 20 mg daily prophylactic and patient will have close follow up in office in 2 weeks. Patient is discharged home in stable condition on  10/15/19         Physical exam  Vitals:   10/13/19 2111 10/14/19 0513 10/14/19 1204 10/15/19 0520  BP: 111/66 110/74 116/63 (!) 107/55  Pulse: 82 74 79 67  Resp: '18 18  16  '$ Temp: 98.3 F (36.8 C) 97.8 F (36.6 C)  97.8 F (36.6 C)  TempSrc:  Oral Oral  Axillary  SpO2: 100% 100%    Weight:      Height:       General: alert, cooperative and no distress Lochia: appropriate Uterine Fundus: firm Incision: Healing well with no significant drainage, Dressing is clean, dry, and intact DVT Evaluation: No cords or calf tenderness. No significant calf/ankle edema. Labs: Lab Results  Component Value Date   WBC 13.5 (H) 10/13/2019   HGB 12.2 10/13/2019   HCT 37.3 10/13/2019   MCV 87.4 10/13/2019   PLT 156 10/13/2019   No flowsheet data found. Edinburgh Postnatal Depression Scale Screening Tool 10/15/2019  I have been able to laugh and see the funny side of things. (No Data)   Vaccines: TDaP UTD         Flu    UTD  Discharge instruction:  per After Visit Summary,  [facility] booklet and  "Understanding Mother & Baby Care" hospital booklet  After Visit Meds:  Allergies as of 10/15/2019      Reactions   Penicillins Nausea And Vomiting, Rash   Childhood   Meat [alpha-gal] Diarrhea, Nausea And Vomiting   Red meat and Pork Bodyache   Amoxicillin Nausea And Vomiting   Aviane [levonorgestrel-ethinyl Estrad]    Worsened PMS symptoms, SE   Erythromycin Nausea Only      Medication List  TAKE these medications   acetaminophen 500 MG tablet Commonly known as: TYLENOL Take 2 tablets (1,000 mg total) by mouth every 6 (six) hours as needed for mild pain.   albuterol 108 (90 Base) MCG/ACT inhaler Commonly known as: VENTOLIN HFA Inhale 2 puffs into the lungs every 6 (six) hours as needed for wheezing or shortness of breath.   escitalopram 10 MG tablet Commonly known as: LEXAPRO Take 1 tablet (10 mg total) by mouth daily for 30 doses.   fluticasone 0.005 % ointment Commonly known as: CUTIVATE Apply topically 2 (two) times daily.   ibuprofen 800 MG tablet Commonly known as: ADVIL Take 1 tablet (800 mg total) by mouth every 8 (eight) hours.   oxyCODONE 5 MG immediate release tablet Commonly known as: Oxy  IR/ROXICODONE Take 1-2 tablets (5-10 mg total) by mouth every 4 (four) hours as needed for moderate pain.   PRE-NATAL FORMULA PO Take 1 tablet by mouth daily at 12 noon. Nature made   senna-docusate 8.6-50 MG tablet Commonly known as: Senokot-S Take 2 tablets by mouth daily. Start taking on: Oct 16, 2019   simethicone 80 MG chewable tablet Commonly known as: MYLICON Chew 1 tablet (80 mg total) by mouth as needed for flatulence.   ZyrTEC Allergy 10 MG tablet Generic drug: cetirizine Take 10 mg by mouth daily.       Diet: routine diet  Activity: Advance as tolerated. Pelvic rest for 6 weeks.   Postpartum contraception: Not Discussed  Newborn Data: Live born female  Birth Weight: 7 lb 3.9 oz (3285 g) APGAR: 18, 9  Newborn Delivery   Birth date/time: 10/12/2019 10:27:00 Delivery type: C-Section, Low Transverse Trial of labor: No C-section categorization: Primary      named Glennon Mac Baby Feeding: Breast Disposition: pending DC. May remain for observation additional day for fast respiratory rate. Circumcision may be deferred until outpatient.    Delivery Report:  Review the Delivery Report for details.    Follow up: Follow-up Information    Darliss Cheney, CNM. Schedule an appointment as soon as possible for a visit in 2 week(s).   Specialty: Obstetrics and Gynecology Contact information: [place] [place] [address] [phone]             Signed: Otilio Carpen, MSN 10/15/2019, 8:48 AM

## 2019-10-16 ENCOUNTER — Ambulatory Visit: Payer: Self-pay

## 2019-10-16 NOTE — Lactation Note (Signed)
This note was copied from a baby's chart. Lactation Consultation Note  Patient Name: Hannah Clark JSHFW'Y Date: 10/16/2019 Reason for consult: Follow-up assessment  P1 mother whose infant is now 84 days old.  This is a term baby born at 39+2 weeks.  Baby was admitted to the NICU on 10/15/19  for tachypnea and poor feeding.  RN requested lactation assistance.  Baby had a respiratory rate within normal limits when I arrived.  Mother had baby latched to the left breast in the football hold when I arrived.  Baby was latched and sucking with rapid flutter sucks.  Assisted mother to latch him deeper.  Father was dripping EBM on mother's areola to keep baby interested in latching.  Baby was calm with the feeding but only seemed to be interested in sustaining a latch when the father was dripping milk on the breast.  He continued to feed for an additional 10 minutes before pulling off the breast.  Mother attempted to burp and then latched to the right breast.  Baby was no longer interested in feeding.   Mother's breasts are full but not engorged and her nipples are short shafted and intact.  Suggested father hold baby and mother pump using the DEBP.  Mother agreeable.  Observed her pumping and the #27 flange is the correct size at this time.  Asked mother to continue to evaluate her flange size as she may have to go up a size in the future with pumping.  Informed her of using coconut oil to lubricate the flange for added comfort as needed.    Mother does not have a DEBP for home use at this time.  They have ordered a pump from the insurance company.  However, in the interim, I suggested mother rent a pump from the gift shop because she needs a way to empty her breasts when she is not visiting her baby.  Mother appreciated this suggestion and father will go to the gift shop for pump rental.    Mother will continue to feed every three hours or sooner if baby shows feeding cues.  The RN had increased his  supplementation to 45 mls today per orders.  I suggested baby consume more as desired due to gestational age and hours of life.   Encouraged mother to call for further questions/concerns.  Parents work well together and praised mother's efforts and willingness to breast feed and post pump.       Maternal Data    Feeding Feeding Type: Breast Milk Nipple Type: Other  LATCH Score Latch: Grasps breast easily, tongue down, lips flanged, rhythmical sucking.  Audible Swallowing: Spontaneous and intermittent  Type of Nipple: Everted at rest and after stimulation  Comfort (Breast/Nipple): Filling, red/small blisters or bruises, mild/mod discomfort  Hold (Positioning): Assistance needed to correctly position infant at breast and maintain latch.  LATCH Score: 8  Interventions    Lactation Tools Discussed/Used     Consult Status Consult Status: PRN Date: 10/16/19 Follow-up type: Call as needed    Leverett Camplin R Jossiah Smoak 10/16/2019, 3:12 PM

## 2019-10-18 ENCOUNTER — Ambulatory Visit: Payer: Self-pay

## 2019-10-18 NOTE — Lactation Note (Addendum)
This note was copied from a baby's chart. Lactation Consultation Note  Patient Name: Hannah Clark CLEXN'T Date: 10/18/2019   Infant is 66 days old. During NICU rounding, I checked on Mom. Mom reported that there was a big discrepancy between what she was able to pump from each breast. Mom reports that she can pump about 60 mL from her L breast, but only 10 mL from her R breast. I asked permission to look at her breasts. Mom's breasts feel heavy.   At first glance, with nipples at rest, her nipples look like they need a size 24 flange (R nipple looks like it could be borderline size 21/24). Mom agreed that pumping was more comfortable on her L breast with the size 24 flange. After trialing the smaller sizes on the R breast, Mom felt that the size 27 flange was the most comfortable for her R breast.  When the flange was removed from the R breast, it was easy to discern where the size 27 flange had pulled in areola (however, the size 24 flange was not as comfortable for her).   I made a few suggestions: Pumpin' Pals (I feel that her nipples are slightly elastic) or pumping one breast at a time so she could massage the other breast. Mom informed me that she has recently ordered an Elvie pump.   I did note that Mom's nipples tend to turn colors with pumping/exposure to air (L nipple: purple; R nipple: blanched). They do not cause Mom discomfort.   Hand expression was also taught to Mom so that more could be obtained from the R breast. Mom understands that her R breast might begin to involute if more milk is not expressed from the R side. We were able to get an additional 13 mL from the R breast & more from the L breast.  Lurline Hare [facility] 10/18/2019, 2:00 PM

## 2019-11-26 ENCOUNTER — Other Ambulatory Visit: Payer: Self-pay | Admitting: Obstetrics and Gynecology

## 2019-11-26 DIAGNOSIS — N63 Unspecified lump in unspecified breast: Secondary | ICD-10-CM

## 2019-12-05 ENCOUNTER — Ambulatory Visit
Admission: RE | Admit: 2019-12-05 | Discharge: 2019-12-05 | Disposition: A | Discharge status: Home / Self Care | Payer: BC Managed Care – PPO | Source: Ambulatory Visit | Attending: Obstetrics and Gynecology | Admitting: Obstetrics and Gynecology

## 2019-12-05 ENCOUNTER — Other Ambulatory Visit: Payer: Self-pay | Admitting: Obstetrics and Gynecology

## 2019-12-05 ENCOUNTER — Other Ambulatory Visit: Payer: Self-pay

## 2019-12-05 DIAGNOSIS — N611 Abscess of the breast and nipple: Secondary | ICD-10-CM

## 2019-12-05 DIAGNOSIS — N63 Unspecified lump in unspecified breast: Secondary | ICD-10-CM

## 2019-12-09 ENCOUNTER — Other Ambulatory Visit: Payer: Self-pay

## 2019-12-09 ENCOUNTER — Other Ambulatory Visit: Payer: Self-pay | Admitting: Obstetrics and Gynecology

## 2019-12-09 ENCOUNTER — Ambulatory Visit
Admission: RE | Admit: 2019-12-09 | Discharge: 2019-12-09 | Disposition: A | Discharge status: Home / Self Care | Payer: BC Managed Care – PPO | Source: Ambulatory Visit | Attending: Obstetrics and Gynecology | Admitting: Obstetrics and Gynecology

## 2019-12-09 DIAGNOSIS — N611 Abscess of the breast and nipple: Secondary | ICD-10-CM

## 2019-12-09 DIAGNOSIS — N63 Unspecified lump in unspecified breast: Secondary | ICD-10-CM

## 2019-12-10 LAB — AEROBIC/ANAEROBIC CULTURE W GRAM STAIN (SURGICAL/DEEP WOUND)
Culture: NO GROWTH
Special Requests: NORMAL

## 2019-12-13 ENCOUNTER — Ambulatory Visit
Admission: RE | Admit: 2019-12-13 | Discharge: 2019-12-13 | Disposition: A | Discharge status: Home / Self Care | Payer: BC Managed Care – PPO | Source: Ambulatory Visit | Attending: Obstetrics and Gynecology | Admitting: Obstetrics and Gynecology

## 2019-12-13 ENCOUNTER — Other Ambulatory Visit: Payer: BC Managed Care – PPO

## 2019-12-13 ENCOUNTER — Other Ambulatory Visit: Payer: Self-pay

## 2019-12-13 DIAGNOSIS — N611 Abscess of the breast and nipple: Secondary | ICD-10-CM

## 2019-12-13 DIAGNOSIS — N63 Unspecified lump in unspecified breast: Secondary | ICD-10-CM

## 2019-12-19 ENCOUNTER — Other Ambulatory Visit: Payer: Self-pay | Admitting: Obstetrics and Gynecology

## 2019-12-19 ENCOUNTER — Ambulatory Visit
Admission: RE | Admit: 2019-12-19 | Discharge: 2019-12-19 | Disposition: A | Discharge status: Home / Self Care | Payer: BC Managed Care – PPO | Source: Ambulatory Visit | Attending: Obstetrics and Gynecology | Admitting: Obstetrics and Gynecology

## 2019-12-19 ENCOUNTER — Other Ambulatory Visit: Payer: Self-pay

## 2019-12-19 DIAGNOSIS — N63 Unspecified lump in unspecified breast: Secondary | ICD-10-CM

## 2020-02-18 ENCOUNTER — Other Ambulatory Visit: Payer: BC Managed Care – PPO

## 2020-03-09 ENCOUNTER — Other Ambulatory Visit: Payer: Self-pay | Admitting: General Surgery

## 2020-03-09 ENCOUNTER — Ambulatory Visit
Admission: RE | Admit: 2020-03-09 | Discharge: 2020-03-09 | Disposition: A | Discharge status: Home / Self Care | Payer: BC Managed Care – PPO | Source: Ambulatory Visit | Attending: General Surgery | Admitting: General Surgery

## 2020-03-09 ENCOUNTER — Other Ambulatory Visit: Payer: Self-pay

## 2020-03-09 DIAGNOSIS — N61 Mastitis without abscess: Secondary | ICD-10-CM

## 2020-04-29 ENCOUNTER — Other Ambulatory Visit: Payer: Self-pay | Admitting: Obstetrics and Gynecology

## 2020-04-29 DIAGNOSIS — N61 Mastitis without abscess: Secondary | ICD-10-CM

## 2020-04-30 ENCOUNTER — Other Ambulatory Visit: Payer: BC Managed Care – PPO

## 2020-04-30 ENCOUNTER — Ambulatory Visit
Admission: RE | Admit: 2020-04-30 | Discharge: 2020-04-30 | Disposition: A | Discharge status: Home / Self Care | Payer: BC Managed Care – PPO | Source: Ambulatory Visit | Attending: Obstetrics and Gynecology | Admitting: Obstetrics and Gynecology

## 2020-04-30 ENCOUNTER — Other Ambulatory Visit: Payer: Self-pay

## 2020-04-30 ENCOUNTER — Other Ambulatory Visit: Payer: Self-pay | Admitting: Obstetrics and Gynecology

## 2020-04-30 DIAGNOSIS — N61 Mastitis without abscess: Secondary | ICD-10-CM

## 2020-07-01 ENCOUNTER — Other Ambulatory Visit: Payer: Self-pay

## 2020-07-01 ENCOUNTER — Ambulatory Visit
Admission: RE | Admit: 2020-07-01 | Discharge: 2020-07-01 | Disposition: A | Discharge status: Home / Self Care | Payer: BC Managed Care – PPO | Source: Ambulatory Visit | Attending: Obstetrics and Gynecology | Admitting: Obstetrics and Gynecology

## 2020-07-01 DIAGNOSIS — N61 Mastitis without abscess: Secondary | ICD-10-CM

## 2021-03-17 IMAGING — MG MM DIGITAL DIAGNOSTIC UNILAT*R* W/ TOMO W/ CAD
4 series · 5 of 12 positions shown · non-contrast
Comparison: Previous exam(s).

CLINICAL DATA: 24-year-old breast feeding female who has had
recurrent episodes of right breast mastitis since Saturday October, 2019. She
states that this is her tenth occurrence, and is the worst she has
had thus far. Pain became severe [REDACTED] night, and she had marked
erythema, warmth and tenderness of the right breast. Due to the pain
she had not done a thorough breast exam, but does feel that the
breast is the tightest in the medial and lateral aspect. She is
currently on a course of Bactrim prescribed by her physician.

EXAM:
DIGITAL DIAGNOSTIC UNILATERAL RIGHT MAMMOGRAM WITH TOMO AND CAD;
ULTRASOUND RIGHT BREAST LIMITED

[R MLO synth-2D]
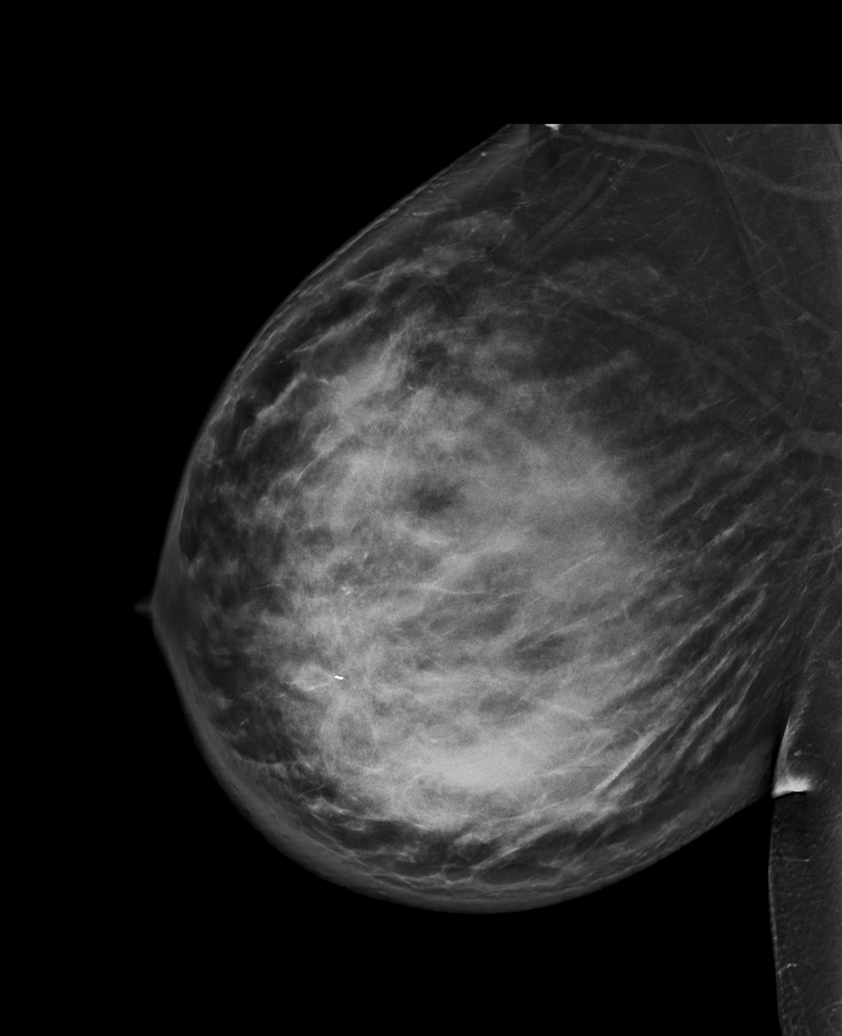

[R CC synth-2D]
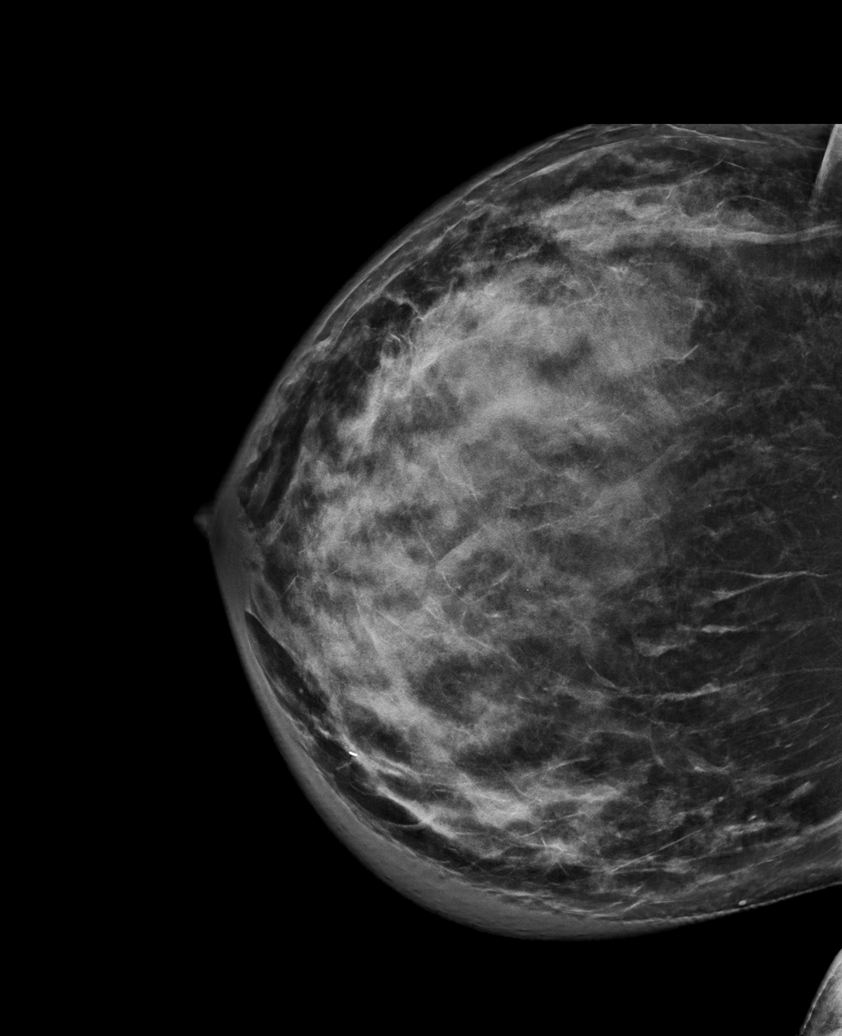

[R MLO tomo · 2 of 106 frames shown]
[frame 35/106]
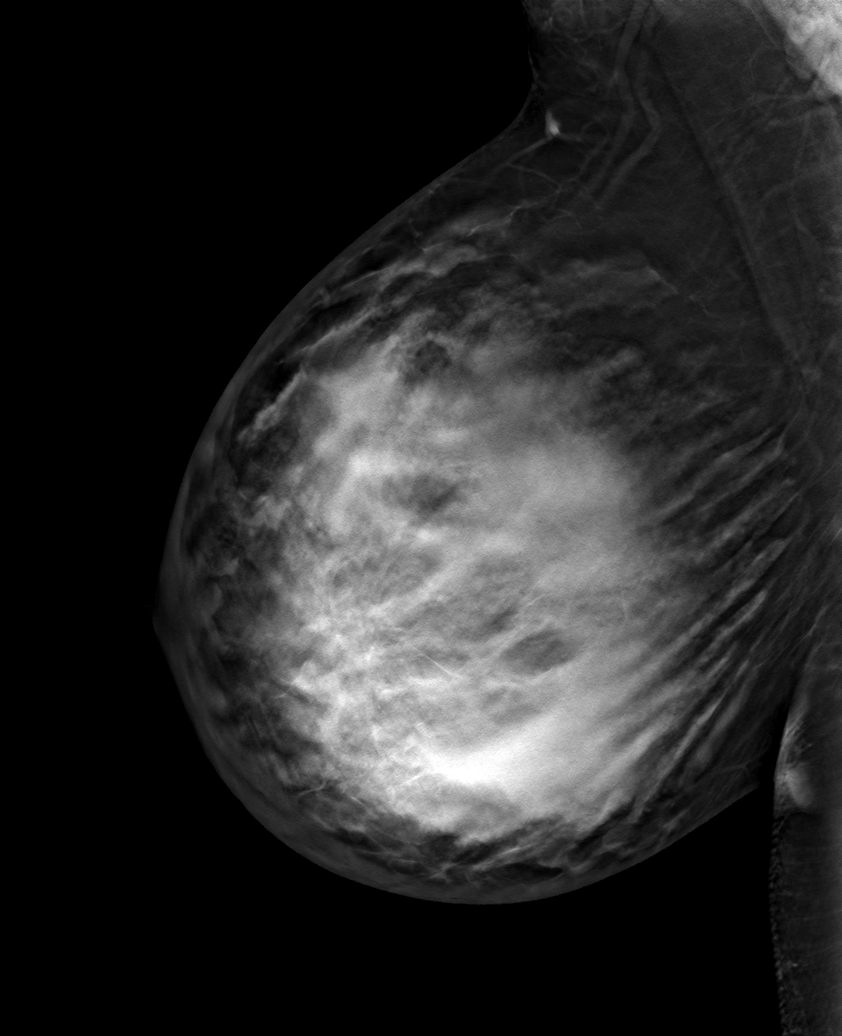
[frame 53/106]
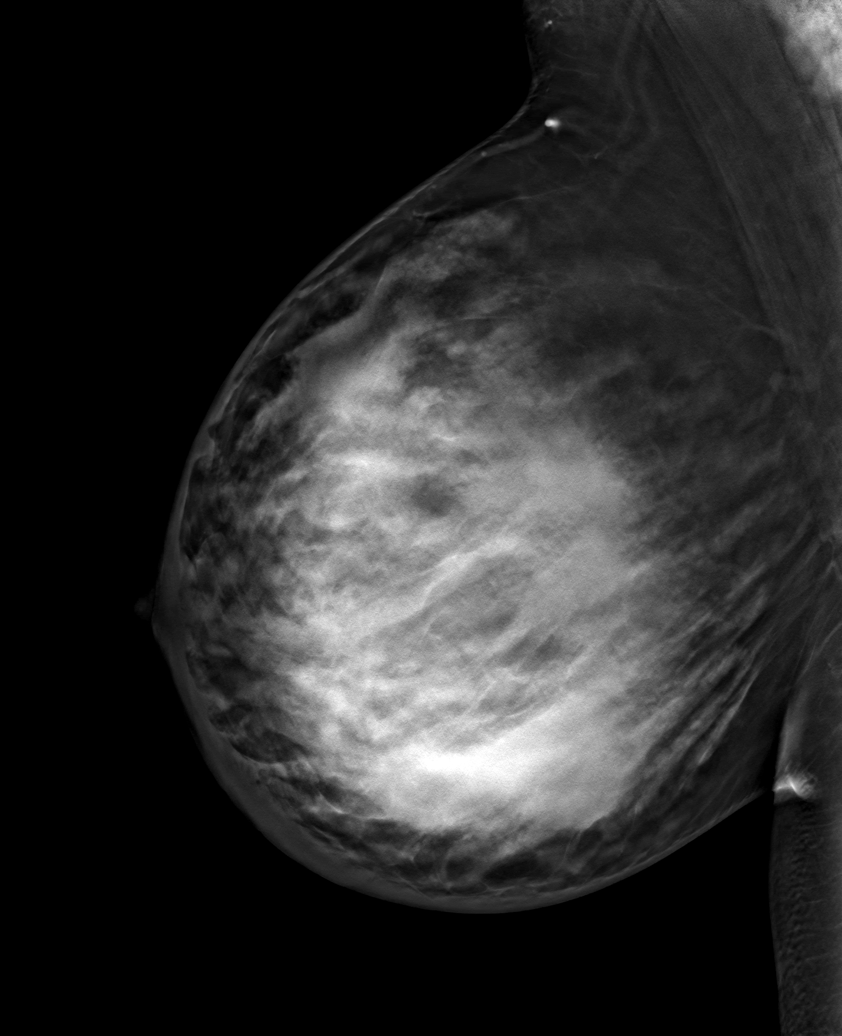

[R CC tomo · tomo slice 46/91.0]
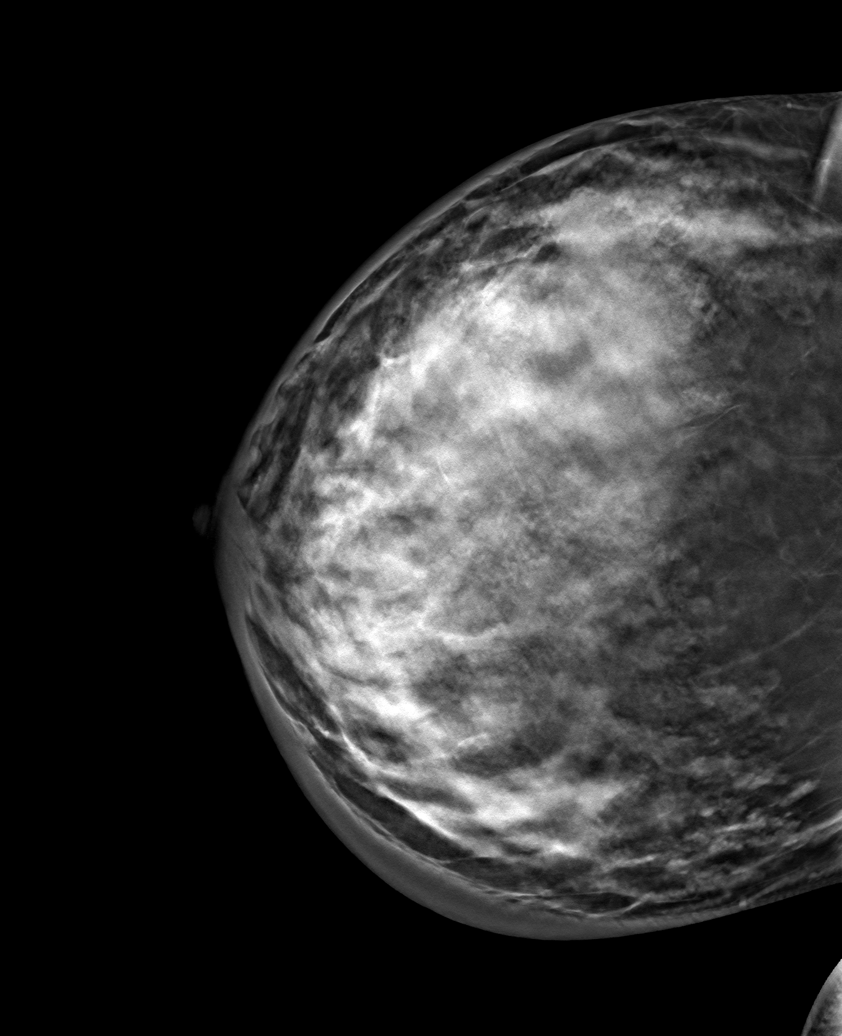

[5 of 12 positions shown; findings below may reference images not displayed]

ACR Breast Density Category d: The breast tissue is extremely dense,
which lowers the sensitivity of mammography.
FINDINGS: Tomosynthesis images of the right breast demonstrates an asymmetry
in the central to superior posterior right breast. No distortion or
abnormal calcifications are identified. There is diffuse skin
thickening, most prominent along the medial, inferior and anterior
aspect of the right breast.

Mammographic images were processed with CAD.

Ultrasound of the right breast was performed. In the superior aspect
of the right breast in the region of the questioned asymmetry, no
suspicious findings are identified. There are multiple prominent
retroareolar ducts, as expected in a breast feeding female.
Ultrasound of the medial aspect of the right breast in the region of
tightness and marked skin thickening, skin thickening is noted on
the ultrasound measuring up to approximately 1.1 cm thick. There is
edema within the tissues, but no fluid collections or masses are
identified. Ultrasound of the area of tightness in the lateral
aspect of the right breast also demonstrates skin thickening, though
more mild than seen in the medial breast. No fluid collections or
masses are identified. In the inferior right breast at 6-7 o'clock,
there is a more focal hypoechoic region measuring 3.4 x 1.3 x
cm. Blood flow is seen within this tissue on color Doppler imaging.
This tissue may represent normal or inflamed breast tissue, however
due to the focality, it may also represent a mass, though I feel
this is less likely to be the case. Ultrasound of the right axilla
demonstrated no abnormal appearing lymph nodes.
IMPRESSION: 1. There is a 3.4 cm focal hypoechoic area of tissue in the right
breast at approximately [DATE]. This is favored to represent normal or
inflamed fibroglandular tissue, however follow-up is warranted once
antibiotics are completed.

2. There is marked erythema, warmth and skin thickening of the
majority of the right breast. The patient reports this has improved
with antibiotics. This most likely represents mastitis.

3.  No evidence of right axillary lymphadenopathy.

RECOMMENDATION:
1. The patient should continue on her prescribed course of
antibiotics and they should be continued until complete resolution
of the patient's symptoms. We have scheduled the patient to return
for a follow-up ultrasound on 07/01/2020 to focus on the lesion in
the right breast at [DATE]. I did emphasize to the patient that if her
symptoms recur after antibiotics are completed and before this
follow-up appointment, she should immediately tell her physician and
we may see her back sooner to reassess and possibly biopsy a lesion
in the right breast at [DATE]. Additionally, if her symptoms do recur
a skin punch biopsy by general surgery may also be considered to
assess for an unlikely but possible inflammatory breast cancer.

I have discussed the findings and recommendations with the patient.
If applicable, a reminder letter will be sent to the patient
regarding the next appointment.

BI-RADS CATEGORY  3: Probably benign.

## 2021-11-25 ENCOUNTER — Ambulatory Visit (INDEPENDENT_AMBULATORY_CARE_PROVIDER_SITE_OTHER): Payer: BC Managed Care – PPO | Admitting: Allergy & Immunology

## 2021-11-25 ENCOUNTER — Encounter: Payer: Self-pay | Admitting: Allergy & Immunology

## 2021-11-25 VITALS — BP 130/78 | HR 90 | Temp 97.9°F | Ht 63.0 in | Wt 192.6 lb

## 2021-11-25 DIAGNOSIS — J310 Chronic rhinitis: Secondary | ICD-10-CM | POA: Diagnosis not present

## 2021-11-25 DIAGNOSIS — T782XXD Anaphylactic shock, unspecified, subsequent encounter: Secondary | ICD-10-CM

## 2021-11-25 DIAGNOSIS — T781XXA Other adverse food reactions, not elsewhere classified, initial encounter: Secondary | ICD-10-CM

## 2021-11-25 MED ORDER — EPINEPHRINE 0.3 MG/0.3ML IJ SOAJ: 0.3000 mg | INTRAMUSCULAR | 2 refills | Status: AC | PRN

## 2021-11-25 NOTE — Patient Instructions (Addendum)
1. Anaphylaxis, subsequent encounter - Testing ws negative to mammalian meats as well as milk/. - We are going to get some blood work to confirm this. - Anaphylaxis management plan provided. [name] training reviewed   2. Chronic rhinitis  - Testing was negative to everything we tested. - Continue with cetirizine 10mg  daily. - We could do more aggressive testing, but your symptoms do not seem overly severe, so I hate to put you through that. - We can consider that in the future if needed.  3. Return in about 3 months (around 02/25/2022).    Please inform 02/27/2022 of any Emergency Department visits, hospitalizations, or changes in symptoms. Call Hannah Clark before going to the ED for breathing or allergy symptoms since we might be able to fit you in for a sick visit. Feel free to contact Hannah Clark anytime with any questions, problems, or concerns.  It was a pleasure to meet you today!  Websites that have reliable patient information: 1. American Academy of Asthma, Allergy, and Immunology: www.aaaai.org 2. Food Allergy Research and Education (FARE): foodallergy.org 3. Mothers of Asthmatics: http://www.asthmacommunitynetwork.org 4. American College of Allergy, Asthma, and Immunology: www.acaai.org   COVID-19 Vaccine Information can be found at: Hannah Clark For questions related to vaccine distribution or appointments, please email [email or web address] or call [phone].   We realize that you might be concerned about having an allergic reaction to the COVID19 vaccines. To help with that concern, WE ARE OFFERING THE COVID19 VACCINES IN OUR OFFICE! Ask the front desk for dates!     "Like" [phone] on Facebook and Instagram for our latest updates!      A healthy democracy works best when Hannah Clark participate! Make sure you are registered to vote! If you have moved or changed any of your contact information, you will need to get this updated  before voting!  In some cases, you MAY be able to register to vote online: [employer]      Airborne Adult Perc - 11/25/21 1513     Time Antigen Placed 1518    Allergen Manufacturer 11/27/21    Location Back    Number of Test 59    1. Control-Buffer 50% Glycerol Negative    2. Control-Histamine 1 mg/ml 2+    3. Albumin saline Negative    4. Bahia Negative    5. Waynette Buttery Negative    6. Johnson Negative    7. Kentucky Blue Negative    8. Meadow Fescue Negative    9. Perennial Rye Negative    10. Sweet Vernal Negative    11. Timothy Negative    12. Cocklebur Negative    13. Burweed Marshelder Negative    14. Ragweed, short Negative    15. Ragweed, Giant Negative    16. Plantain,  English Negative    17. Lamb's Quarters Negative    18. Sheep Sorrell Negative    19. Rough Pigweed Negative    20. Marsh Elder, Rough Negative    21. Mugwort, Common Negative    22. Ash mix Negative    23. Birch mix Negative    24. Beech American Negative    25. Box, Elder Negative    26. Cedar, red Negative    27. Cottonwood, French Southern Territories Negative    28. Elm mix Negative    29. Hickory Negative    30. Maple mix Negative    31. Oak, Guinea-Bissau mix Negative    32. Pecan Pollen Negative    33. Pine mix Negative  34. Sycamore Eastern Negative    35. Walnut, Black Pollen Negative    36. Alternaria alternata Negative    37. Cladosporium Herbarum Negative    38. Aspergillus mix Negative    39. Penicillium mix Negative    40. Bipolaris sorokiniana (Helminthosporium) Negative    41. Drechslera spicifera (Curvularia) Negative    42. Mucor plumbeus Negative    43. Fusarium moniliforme Negative    44. Aureobasidium pullulans (pullulara) Negative    45. Rhizopus oryzae Negative    46. Botrytis cinera Negative    47. Epicoccum nigrum Negative    48. Phoma betae Negative    49. Candida Albicans Negative    50. Trichophyton mentagrophytes Negative    51. Mite, D  Farinae  5,000 AU/ml Negative    52. Mite, D Pteronyssinus  5,000 AU/ml Negative    53. Cat Hair 10,000 BAU/ml Negative    54.  Dog Epithelia Negative    55. Mixed Feathers Negative    56. Horse Epithelia Negative    57. Cockroach, German Negative    58. Mouse Negative    59. Tobacco Leaf Negative             Food Adult Perc - 11/25/21 1500     Time Antigen Placed 1519    Allergen Manufacturer Waynette Buttery    Location Back    Number of allergen test 5    5. Milk, cow Negative    7. Casein Negative    37. Pork Negative    40. Beef Negative    41. Lamb Negative

## 2021-11-25 NOTE — Progress Notes (Signed)
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 Anaphylaxis - Testing ws negative to mammalian meats as well as milk/. - We are going to get some blood work to confirm this. - Anaphylaxis management plan provided. Hannah Clark training reviewed   2. Chronic rhinitis  - Testing was negative to everything we tested. - Continue with cetirizine 10mg  daily. - We could do more aggressive testing, but your symptoms do not seem overly severe, so I hate to put you through that. - We can consider that in the future if needed.  3. Return in about 3 months (around 02/25/2022).     This note in its entirety was forwarded to the Provider who requested this consultation.  Subjective:   Hannah Clark is a 26 y.o. female presenting today for evaluation of No chief complaint on file.   Hannah Clark has a history of the following: Patient Active Problem List   Diagnosis Date Noted   Postpartum care following cesarean delivery (5/1) 10/14/2019   Breech presentation 10/12/2019   Chronic migraine without aura without status migrainosus, not intractable 04/23/2016    History obtained from: chart review and patient.  Hannah Clark was referred by Patient, No Pcp Per.     Hannah Clark is a 26 y.o. female presenting for an evaluation of food allergies, possibly alpha gal . She started having issues in 2018. She was in school at [facility] [employer]. She was bit by a tick out there. She remembers that she started getting sick a lot and she was pinpointing it to red meat. She has been avoiding red meat since that time. She has had 4-5 ticks as well.   Since that time, she has had times of contamination that have become more worse in intensity. She will have vomiting and  diarrhea and a headache. The last time that this happened included SOB and chest pain. They were out of town with Hannah brother who has an EpiPen.  She estimates that she has had two cross contamination episodes over the past 6 months. She is wondering about milk. She has always some milk issues resulting in diarrhea. This is all focused on GI issues.   Allergic Rhinitis Symptom History: She uses cetirizine daily.  She had testing that was positive to mildew.  Reviewed the records from [facility] from 10 years ago.  She was positive to molds very mildly.  She has never been on allergy shots.  She has never been on a nasal spray.  She had a baby boy two years ago. He is full of energy.   She has a brother with issues with mold and mildew.   Otherwise, there is no history of other atopic diseases, including asthma, drug allergies, stinging insect allergies, or contact dermatitis. There is no significant infectious history. Vaccinations are up to date.    Past Medical History: Patient Active Problem List   Diagnosis Date Noted   Postpartum care following cesarean delivery (5/1) 10/14/2019   Breech presentation 10/12/2019   Chronic migraine without aura without status migrainosus, not intractable 04/23/2016    Medication List:  Allergies as of 11/25/2021       Reactions   Penicillins Nausea And Vomiting, Rash   Childhood  Meat [alpha-gal] Diarrhea, Nausea And Vomiting   Red meat and Pork Bodyache   Amoxicillin Nausea And Vomiting   Aviane [levonorgestrel-ethinyl Estrad]    Worsened PMS symptoms, SE   Erythromycin Nausea Only        Medication List        Accurate as of November 25, 2021  6:18 PM. If you have any questions, ask your nurse or doctor.          STOP taking these medications    albuterol 108 (90 Base) MCG/ACT inhaler Commonly known as: VENTOLIN HFA   escitalopram 10 MG tablet Commonly known as: LEXAPRO   fluticasone 0.005 % ointment Commonly known as:  CUTIVATE   ibuprofen 800 MG tablet Commonly known as: ADVIL   oxyCODONE 5 MG immediate release tablet Commonly known as: Oxy IR/ROXICODONE   senna-docusate 8.6-50 MG tablet Commonly known as: Senokot-S   simethicone 80 MG chewable tablet Commonly known as: MYLICON       TAKE these medications    acetaminophen 500 MG tablet Commonly known as: TYLENOL Take 2 tablets (1,000 mg total) by mouth every 6 (six) hours as needed for mild pain.   EPINEPHrine 0.3 mg/0.3 mL Soaj injection Commonly known as: EPI-PEN Inject 0.3 mg into the muscle as needed for anaphylaxis.   Lo Loestrin Fe 1 MG-10 MCG / 10 MCG tablet Generic drug: Norethindrone-Ethinyl Estradiol-Fe Biphas Take 1 tablet by mouth daily.   PRE-NATAL FORMULA PO Take 1 tablet by mouth daily at 12 noon. Nature made   ZyrTEC Allergy 10 MG tablet Generic drug: cetirizine Take 10 mg by mouth daily.        Birth History: born at term without complications  Developmental History: non-contributory  Past Surgical History: Past Surgical History:  Procedure Laterality Date   CESAREAN SECTION N/A 10/12/2019   Procedure: Primary CESAREAN SECTION;  Surgeon: Noland Fordyce, MD;  Location: [facility] ORS;  Service: Obstetrics;  Laterality: N/A;  EDD: 10/17/19   CYSTECTOMY     leg   TONSILLECTOMY     WISDOM TOOTH EXTRACTION       Clark History: Clark History  Problem Relation Age of Onset   Hypertension Mother    Depression Mother    Fibromyalgia Mother    Diverticulitis Mother    Migraines Mother    Diabetes Mother    Hypertension Father    Diverticulitis Father    Heart disease Father    Heart attack Father    Depression Maternal Grandmother    Diabetes Maternal Grandmother    Fibromyalgia Maternal Grandmother    Cirrhosis Maternal Grandmother    Hypertension Paternal Grandmother    Dementia Paternal Grandmother    Heart attack Paternal Grandfather      Social History: Hannah Clark lives at home with Hannah Clark.   She lives in a house that is 27 years old.  There is electric vinyl plank throughout the home.  She has electric heating and central cooling.  There are 2 dogs inside of the home.  She does have dust mite covers on Hannah bedding.  There are no roaches in the home.  There is no tobacco exposure.  She is currently a Runner, broadcasting/film/video.  She is not exposed to fumes, chemicals, or dust.  She does not live near an interstate or industrial area. She is a middle school Editor, commissioning.  Hannah husband was in Patent examiner but left the field.  He originally wanted to become a Engineer, maintenance (IT).  Review of Systems  Constitutional: Negative.  Negative for chills, fever, malaise/fatigue and weight loss.  HENT: Negative.  Negative for congestion, ear discharge and ear pain.   Eyes:  Negative for pain, discharge and redness.  Respiratory:  Negative for cough, sputum production, shortness of breath and wheezing.   Cardiovascular: Negative.  Negative for chest pain and palpitations.  Gastrointestinal:  Positive for abdominal pain, diarrhea, heartburn and nausea. Negative for constipation and vomiting.  Skin: Negative.  Negative for itching and rash.  Neurological:  Negative for dizziness and headaches.  Endo/Heme/Allergies:  Negative for environmental allergies. Does not bruise/bleed easily.       Objective:   Blood pressure 130/78, pulse 90, temperature 97.9 F (36.6 C), height 5\' 3"  (1.6 m), weight 192 lb 9.6 oz (87.4 kg), SpO2 98 %, unknown if currently breastfeeding. Body mass index is 34.12 kg/m.     Physical Exam Vitals reviewed.  Constitutional:      Appearance: She is well-developed.     Comments: Very pleasant.  HENT:     Head: Normocephalic and atraumatic.     Right Ear: Tympanic membrane, ear canal and external ear normal. No drainage, swelling or tenderness. Tympanic membrane is not injected, scarred, erythematous, retracted or bulging.     Left Ear: Tympanic membrane, ear canal and external ear normal. No  drainage, swelling or tenderness. Tympanic membrane is not injected, scarred, erythematous, retracted or bulging.     Nose: No nasal deformity, septal deviation, mucosal edema or rhinorrhea.     Right Turbinates: Enlarged, swollen and pale.     Left Turbinates: Enlarged, swollen and pale.     Right Sinus: No maxillary sinus tenderness or frontal sinus tenderness.     Left Sinus: No maxillary sinus tenderness or frontal sinus tenderness.     Comments: Scant clear rhinorrhea.    Mouth/Throat:     Mouth: Mucous membranes are not pale and not dry.     Pharynx: Uvula midline.  Eyes:     General:        Right eye: No discharge.        Left eye: No discharge.     Conjunctiva/sclera: Conjunctivae normal.     Right eye: Right conjunctiva is not injected. No chemosis.    Left eye: Left conjunctiva is not injected. No chemosis.    Pupils: Pupils are equal, round, and reactive to light.  Cardiovascular:     Rate and Rhythm: Normal rate and regular rhythm.     Heart sounds: Normal heart sounds.  Pulmonary:     Effort: Pulmonary effort is normal. No tachypnea, accessory muscle usage or respiratory distress.     Breath sounds: Normal breath sounds. No wheezing, rhonchi or rales.     Comments: Moving air well in all lung fields.  No increased work of breathing. Chest:     Chest wall: No tenderness.  Abdominal:     Tenderness: There is no abdominal tenderness. There is no guarding or rebound.  Lymphadenopathy:     Head:     Right side of head: No submandibular, tonsillar or occipital adenopathy.     Left side of head: No submandibular, tonsillar or occipital adenopathy.     Cervical: No cervical adenopathy.  Skin:    General: Skin is warm.     Capillary Refill: Capillary refill takes less than 2 seconds.     Coloration: Skin is not pale.     Findings: No abrasion, erythema, petechiae or rash. Rash is not papular, urticarial or vesicular.  Comments: No eczematous or urticarial lesions noted.   Neurological:     Mental Status: She is alert.  Psychiatric:        Behavior: Behavior is cooperative.      Diagnostic studies:   Allergy Studies:     Airborne Adult Perc - 11/25/21 1513     Time Antigen Placed 1518    Allergen Manufacturer Waynette Buttery    Location Back    Number of Test 59    1. Control-Buffer 50% Glycerol Negative    2. Control-Histamine 1 mg/ml 2+    3. Albumin saline Negative    4. Bahia Negative    5. French Southern Territories Negative    6. Johnson Negative    7. Kentucky Blue Negative    8. Meadow Fescue Negative    9. Perennial Rye Negative    10. Sweet Vernal Negative    11. Timothy Negative    12. Cocklebur Negative    13. Burweed Marshelder Negative    14. Ragweed, short Negative    15. Ragweed, Giant Negative    16. Plantain,  English Negative    17. Lamb's Quarters Negative    18. Sheep Sorrell Negative    19. Rough Pigweed Negative    20. Marsh Elder, Rough Negative    21. Mugwort, Common Negative    22. Ash mix Negative    23. Birch mix Negative    24. Beech American Negative    25. Box, Elder Negative    26. Cedar, red Negative    27. Cottonwood, Guinea-Bissau Negative    28. Elm mix Negative    29. Hickory Negative    30. Maple mix Negative    31. Oak, Guinea-Bissau mix Negative    32. Pecan Pollen Negative    33. Pine mix Negative    34. Sycamore Eastern Negative    35. Walnut, Black Pollen Negative    36. Alternaria alternata Negative    37. Cladosporium Herbarum Negative    38. Aspergillus mix Negative    39. Penicillium mix Negative    40. Bipolaris sorokiniana (Helminthosporium) Negative    41. Drechslera spicifera (Curvularia) Negative    42. Mucor plumbeus Negative    43. Fusarium moniliforme Negative    44. Aureobasidium pullulans (pullulara) Negative    45. Rhizopus oryzae Negative    46. Botrytis cinera Negative    47. Epicoccum nigrum Negative    48. Phoma betae Negative    49. Candida Albicans Negative    50. Trichophyton mentagrophytes  Negative    51. Mite, D Farinae  5,000 AU/ml Negative    52. Mite, D Pteronyssinus  5,000 AU/ml Negative    53. Cat Hair 10,000 BAU/ml Negative    54.  Dog Epithelia Negative    55. Mixed Feathers Negative    56. Horse Epithelia Negative    57. Cockroach, German Negative    58. Mouse Negative    59. Tobacco Leaf Negative             Food Adult Perc - 11/25/21 1500     Time Antigen Placed 1519    Allergen Manufacturer Waynette Buttery    Location Back    Number of allergen test 5    5. Milk, cow Negative    7. Casein Negative    37. Pork Negative    40. Beef Negative    41. Lamb Negative             Allergy testing results were read and  interpreted by myself, documented by clinical staff.         Salvatore Marvel, MD [facility] [place] [facility]

## 2021-11-28 LAB — ALLERGEN STINGING INSECT PANEL
Honeybee IgE: <0.1 kU/L
Hornet, White Face, IgE: <0.1 kU/L
Hornet, Yellow, IgE: <0.1 kU/L
Paper Wasp IgE: <0.1 kU/L
Yellow Jacket, IgE: <0.1 kU/L

## 2021-11-28 LAB — ALPHA-GAL PANEL
Allergen Lamb IgE: <0.1 kU/L
Beef IgE: <0.1 kU/L
IgE (Immunoglobulin E), Serum: 5 IU/mL — ABNORMAL LOW (ref 6–495)
O215-IgE Alpha-Gal: <0.1 kU/L
Pork IgE: <0.1 kU/L

## 2021-11-28 LAB — TRYPTASE: Tryptase: 11.1 ug/L (ref 2.2–13.2)

## 2021-12-09 ENCOUNTER — Other Ambulatory Visit: Payer: Self-pay | Admitting: Physician Assistant

## 2021-12-09 DIAGNOSIS — N644 Mastodynia: Secondary | ICD-10-CM

## 2021-12-20 ENCOUNTER — Other Ambulatory Visit: Payer: BC Managed Care – PPO

## 2021-12-24 ENCOUNTER — Other Ambulatory Visit: Payer: Commercial Managed Care - PPO

## 2021-12-28 ENCOUNTER — Ambulatory Visit
Admission: RE | Admit: 2021-12-28 | Discharge: 2021-12-28 | Disposition: A | Discharge status: Home / Self Care | Payer: Commercial Managed Care - PPO | Source: Ambulatory Visit | Attending: Physician Assistant | Admitting: Physician Assistant

## 2021-12-28 DIAGNOSIS — N644 Mastodynia: Secondary | ICD-10-CM

## 2022-10-14 LAB — OB RESULTS CONSOLE ANTIBODY SCREEN: Antibody Screen: NEGATIVE

## 2022-11-22 LAB — HEPATITIS C ANTIBODY: HCV Ab: NEGATIVE

## 2022-11-22 LAB — OB RESULTS CONSOLE RUBELLA ANTIBODY, IGM: Rubella: IMMUNE

## 2022-11-22 LAB — OB RESULTS CONSOLE GC/CHLAMYDIA
Chlamydia: NEGATIVE
Neisseria Gonorrhea: NEGATIVE

## 2022-11-22 LAB — OB RESULTS CONSOLE HEPATITIS B SURFACE ANTIGEN: Hepatitis B Surface Ag: NEGATIVE

## 2022-11-22 LAB — OB RESULTS CONSOLE RPR: RPR: NONREACTIVE

## 2022-11-22 LAB — OB RESULTS CONSOLE HIV ANTIBODY (ROUTINE TESTING): HIV: NONREACTIVE

## 2023-05-22 ENCOUNTER — Other Ambulatory Visit: Payer: Self-pay | Admitting: Certified Nurse Midwife

## 2023-05-22 DIAGNOSIS — N61 Mastitis without abscess: Secondary | ICD-10-CM

## 2023-05-24 LAB — OB RESULTS CONSOLE GBS: GBS: NEGATIVE

## 2023-06-09 ENCOUNTER — Ambulatory Visit
Admission: RE | Admit: 2023-06-09 | Discharge: 2023-06-09 | Disposition: A | Discharge status: Home / Self Care | Payer: Commercial Managed Care - PPO | Source: Ambulatory Visit | Attending: Certified Nurse Midwife | Admitting: Certified Nurse Midwife

## 2023-06-09 DIAGNOSIS — N61 Mastitis without abscess: Secondary | ICD-10-CM

## 2023-06-14 NOTE — L&D Delivery Note (Signed)
   Delivery Note:   G2P1001 at [redacted]w[redacted]d  Admitting diagnosis: Normal labor [O80, Z37.9] Risks: TOLAC; anxiety, no current meds;  Onset of labor: 06/21/2023 at 1230 IOL/Augmentation: AROM and Pitocin  ROM: 06/21/2023 at 1230, clear fluid  Complete dilation at 06/22/2023 0106 Onset of pushing at 0118 FHR second stage Cat I  Analgesia/Anesthesia intrapartum:Epidural Pushing in lithotomy position with CNM and L&D staff support. Husband, Hannah Clark, present for birth and supportive.  Delivery of a Live born female  Birth Weight:  pending APGAR: 9, 9   Newborn Delivery   Birth date/time: 06/22/2023 01:29:23 Delivery type: VBAC, Spontaneous    in cephalic presentation, position OA to ROA.  APGAR:1 min-9 , 5 min-9   Nuchal Cord: No  Cord double clamped after cessation of pulsation, cut by Joe.  Collection of cord blood for typing completed. Arterial cord blood sample-No   Placenta delivered-Spontaneous with 3 vessels. Immediately after placenta delivery, large gush of blood, lower uterine segment with multiple clots expressed, TXA and methergine  given, Dr. Fonnie at the bedside. Will give Ancef  2gm x 1 for manual sweep. Uterotonics: Pitocin , methergine , TXA Placenta to L&D Uterine tone firm  2nd degree;Vaginal laceration identified.  Immediately following delivery, brisk bleeding from vaginal laceration. Started repair while awaiting placenta delivery. Approximated well but oozy. Vaginal packing placed by Dr. Fonnie. Foley placed until vaginal packing is removed in the AM. Episiotomy:None Local analgesia: None  Repair: 3-0 in usual fashion Est. Blood Loss (mL):984.00  Complications: None  Mom to postpartum. Baby Hannah Clark to Couplet care / Skin to Skin.  Delivery Report:   Review the Delivery Report for details.    Hannah Clark, CNM, MSN 06/22/2023, 2:26 AM

## 2023-06-20 ENCOUNTER — Telehealth (HOSPITAL_COMMUNITY): Payer: Self-pay | Admitting: *Deleted

## 2023-06-20 ENCOUNTER — Encounter (HOSPITAL_COMMUNITY): Payer: Self-pay | Admitting: *Deleted

## 2023-06-20 NOTE — Telephone Encounter (Signed)
 Preadmission screen

## 2023-06-20 NOTE — Progress Notes (Signed)
 Spoke with Dr. Judeth Cornfield via telephone on 06/19/2022 regarding patient TOLAC induction scheduled for [redacted]w[redacted]d on 06/21/2022. Dr. Judeth Cornfield agrees with plan of care and induction plan as scheduled.   June Leap, MSN, CNM 06/20/2023, 1:12 PM

## 2023-06-21 ENCOUNTER — Inpatient Hospital Stay (HOSPITAL_COMMUNITY)
Admission: AD | Admit: 2023-06-21 | Discharge: 2023-06-23 | DRG: 806 | Disposition: A | Discharge status: Home / Self Care | Payer: Commercial Managed Care - PPO | Attending: Obstetrics | Admitting: Obstetrics

## 2023-06-21 ENCOUNTER — Inpatient Hospital Stay (HOSPITAL_COMMUNITY): Payer: Commercial Managed Care - PPO | Admitting: Anesthesiology

## 2023-06-21 ENCOUNTER — Encounter (HOSPITAL_COMMUNITY): Payer: Self-pay | Admitting: Obstetrics and Gynecology

## 2023-06-21 ENCOUNTER — Inpatient Hospital Stay (HOSPITAL_COMMUNITY)
Admission: AD | Admit: 2023-06-21 | Discharge: 2023-06-21 | Disposition: A | Discharge status: Home / Self Care | Payer: Commercial Managed Care - PPO | Source: Home / Self Care | Attending: Obstetrics and Gynecology | Admitting: Obstetrics and Gynecology

## 2023-06-21 ENCOUNTER — Other Ambulatory Visit: Payer: Self-pay

## 2023-06-21 ENCOUNTER — Encounter (HOSPITAL_COMMUNITY): Payer: Self-pay | Admitting: Obstetrics

## 2023-06-21 DIAGNOSIS — Z3A40 40 weeks gestation of pregnancy: Secondary | ICD-10-CM | POA: Insufficient documentation

## 2023-06-21 DIAGNOSIS — K219 Gastro-esophageal reflux disease without esophagitis: Secondary | ICD-10-CM | POA: Diagnosis present

## 2023-06-21 DIAGNOSIS — F419 Anxiety disorder, unspecified: Secondary | ICD-10-CM | POA: Diagnosis present

## 2023-06-21 DIAGNOSIS — O480 Post-term pregnancy: Secondary | ICD-10-CM | POA: Insufficient documentation

## 2023-06-21 DIAGNOSIS — O99344 Other mental disorders complicating childbirth: Secondary | ICD-10-CM | POA: Diagnosis present

## 2023-06-21 DIAGNOSIS — O479 False labor, unspecified: Secondary | ICD-10-CM

## 2023-06-21 DIAGNOSIS — O34211 Maternal care for low transverse scar from previous cesarean delivery: Principal | ICD-10-CM | POA: Diagnosis present

## 2023-06-21 DIAGNOSIS — O26893 Other specified pregnancy related conditions, third trimester: Secondary | ICD-10-CM | POA: Diagnosis present

## 2023-06-21 DIAGNOSIS — O9962 Diseases of the digestive system complicating childbirth: Secondary | ICD-10-CM | POA: Diagnosis present

## 2023-06-21 DIAGNOSIS — Z8249 Family history of ischemic heart disease and other diseases of the circulatory system: Secondary | ICD-10-CM

## 2023-06-21 DIAGNOSIS — O34219 Maternal care for unspecified type scar from previous cesarean delivery: Secondary | ICD-10-CM | POA: Diagnosis present

## 2023-06-21 DIAGNOSIS — Z833 Family history of diabetes mellitus: Secondary | ICD-10-CM | POA: Diagnosis not present

## 2023-06-21 DIAGNOSIS — O471 False labor at or after 37 completed weeks of gestation: Secondary | ICD-10-CM | POA: Insufficient documentation

## 2023-06-21 DIAGNOSIS — O9081 Anemia of the puerperium: Secondary | ICD-10-CM | POA: Diagnosis not present

## 2023-06-21 DIAGNOSIS — S3141XA Laceration without foreign body of vagina and vulva, initial encounter: Secondary | ICD-10-CM | POA: Diagnosis not present

## 2023-06-21 DIAGNOSIS — D62 Acute posthemorrhagic anemia: Secondary | ICD-10-CM | POA: Diagnosis not present

## 2023-06-21 LAB — CBC
HCT: 39 % (ref 36.0–46.0)
Hemoglobin: 12.7 g/dL (ref 12.0–15.0)
MCH: 25.4 pg — ABNORMAL LOW (ref 26.0–34.0)
MCHC: 32.6 g/dL (ref 30.0–36.0)
MCV: 78 fL — ABNORMAL LOW (ref 80.0–100.0)
Platelets: 194 10*3/uL (ref 150–400)
RBC: 5 MIL/uL (ref 3.87–5.11)
RDW: 14.4 % (ref 11.5–15.5)
WBC: 11.5 10*3/uL — ABNORMAL HIGH (ref 4.0–10.5)
nRBC: 0 % (ref 0.0–0.2)

## 2023-06-21 LAB — TYPE AND SCREEN
ABO/RH(D): O POS
Antibody Screen: NEGATIVE

## 2023-06-21 LAB — RPR: RPR Ser Ql: NONREACTIVE

## 2023-06-21 MED ORDER — OXYCODONE-ACETAMINOPHEN 5-325 MG PO TABS
1.0000 | ORAL_TABLET | ORAL | Status: DC | PRN
  Administered 2023-06-22: 1 via ORAL
  Filled 2023-06-21: qty 1

## 2023-06-21 MED ORDER — LACTATED RINGERS IV SOLN
500.0000 mL | INTRAVENOUS | Status: DC | PRN
  Administered 2023-06-22: 500 mL via INTRAVENOUS

## 2023-06-21 MED ORDER — LACTATED RINGERS IV SOLN: INTRAVENOUS | Status: DC

## 2023-06-21 MED ORDER — ONDANSETRON HCL 4 MG/2ML IJ SOLN
4.0000 mg | Freq: Four times a day (QID) | INTRAMUSCULAR | Status: DC | PRN
  Administered 2023-06-21: 4 mg via INTRAVENOUS
  Filled 2023-06-21: qty 2

## 2023-06-21 MED ORDER — OXYCODONE-ACETAMINOPHEN 5-325 MG PO TABS: 2.0000 | ORAL_TABLET | ORAL | Status: DC | PRN

## 2023-06-21 MED ORDER — OXYTOCIN-SODIUM CHLORIDE 30-0.9 UT/500ML-% IV SOLN
2.5000 [IU]/h | INTRAVENOUS | Status: DC
  Administered 2023-06-22: 2.5 [IU]/h via INTRAVENOUS

## 2023-06-21 MED ORDER — LIDOCAINE HCL (PF) 1 % IJ SOLN
30.0000 mL | INTRAMUSCULAR | Status: DC | PRN
Start: 2023-06-21 — End: 2023-06-22

## 2023-06-21 MED ORDER — FLEET ENEMA RE ENEM: 1.0000 | ENEMA | RECTAL | Status: DC | PRN

## 2023-06-21 MED ORDER — FENTANYL-BUPIVACAINE-NACL 0.5-0.125-0.9 MG/250ML-% EP SOLN
12.0000 mL/h | EPIDURAL | Status: DC | PRN
  Administered 2023-06-21: 12 mL/h via EPIDURAL
  Filled 2023-06-21: qty 250

## 2023-06-21 MED ORDER — BUPIVACAINE HCL (PF) 0.25 % IJ SOLN
INTRAMUSCULAR | Status: DC | PRN
  Administered 2023-06-21: 10 mL via EPIDURAL

## 2023-06-21 MED ORDER — LIDOCAINE HCL (PF) 1 % IJ SOLN
INTRAMUSCULAR | Status: DC | PRN
  Administered 2023-06-21 (×2): 4 mL via EPIDURAL

## 2023-06-21 MED ORDER — OXYTOCIN-SODIUM CHLORIDE 30-0.9 UT/500ML-% IV SOLN
1.0000 m[IU]/min | INTRAVENOUS | Status: DC
  Administered 2023-06-21: 2 m[IU]/min via INTRAVENOUS
  Filled 2023-06-21: qty 500

## 2023-06-21 MED ORDER — SOD CITRATE-CITRIC ACID 500-334 MG/5ML PO SOLN: 30.0000 mL | ORAL | Status: DC | PRN

## 2023-06-21 MED ORDER — DIPHENHYDRAMINE HCL 50 MG/ML IJ SOLN: 12.5000 mg | INTRAMUSCULAR | Status: DC | PRN

## 2023-06-21 MED ORDER — EPHEDRINE 5 MG/ML INJ
10.0000 mg | INTRAVENOUS | Status: DC | PRN
Start: 2023-06-21 — End: 2023-06-22

## 2023-06-21 MED ORDER — FENTANYL CITRATE (PF) 100 MCG/2ML IJ SOLN
100.0000 ug | INTRAMUSCULAR | Status: DC | PRN
  Administered 2023-06-21: 100 ug via INTRAVENOUS
  Filled 2023-06-21: qty 2

## 2023-06-21 MED ORDER — PHENYLEPHRINE 80 MCG/ML (10ML) SYRINGE FOR IV PUSH (FOR BLOOD PRESSURE SUPPORT)
80.0000 ug | PREFILLED_SYRINGE | INTRAVENOUS | Status: DC | PRN
Start: 2023-06-21 — End: 2023-06-22

## 2023-06-21 MED ORDER — EPHEDRINE 5 MG/ML INJ: 10.0000 mg | INTRAVENOUS | Status: DC | PRN

## 2023-06-21 MED ORDER — LACTATED RINGERS IV SOLN
500.0000 mL | Freq: Once | INTRAVENOUS | Status: AC
  Administered 2023-06-21: 500 mL via INTRAVENOUS

## 2023-06-21 MED ORDER — TERBUTALINE SULFATE 1 MG/ML IJ SOLN
0.2500 mg | Freq: Once | INTRAMUSCULAR | Status: DC | PRN
Start: 2023-06-21 — End: 2023-06-22

## 2023-06-21 MED ORDER — PHENYLEPHRINE 80 MCG/ML (10ML) SYRINGE FOR IV PUSH (FOR BLOOD PRESSURE SUPPORT): 80.0000 ug | PREFILLED_SYRINGE | INTRAVENOUS | Status: DC | PRN

## 2023-06-21 MED ORDER — OXYTOCIN BOLUS FROM INFUSION
333.0000 mL | Freq: Once | INTRAVENOUS | Status: AC
  Administered 2023-06-22: 333 mL via INTRAVENOUS

## 2023-06-21 MED ORDER — ACETAMINOPHEN 325 MG PO TABS: 650.0000 mg | ORAL_TABLET | ORAL | Status: DC | PRN

## 2023-06-21 NOTE — MAU Note (Signed)
 Pt says she is VBAC. UC's strong since 2300. PNC- [facility] VE- Monday- 1 cm- stripped membranes Denies HSV GBS- neg Baby moving

## 2023-06-21 NOTE — Anesthesia Procedure Notes (Signed)
 Epidural Patient location during procedure: OB Start time: 06/21/2023 6:25 PM End time: 06/21/2023 6:30 PM  Staffing Anesthesiologist: Peggye Delon Brunswick, MD Performed: anesthesiologist   Preanesthetic Checklist Completed: patient identified, IV checked, site marked, risks and benefits discussed, surgical consent, monitors and equipment checked, pre-op evaluation and timeout performed  Epidural Patient position: sitting Prep: DuraPrep and site prepped and draped Patient monitoring: continuous pulse ox and blood pressure Approach: midline Location: L3-L4 Injection technique: LOR saline  Needle:  Needle type: Tuohy  Needle gauge: 17 G Needle length: 9 cm and 9 Needle insertion depth: 6.5 cm Catheter type: closed end flexible Catheter size: 19 Gauge Catheter at skin depth: 11 cm Test dose: negative  Assessment Events: blood not aspirated, no cerebrospinal fluid, injection not painful, no injection resistance, no paresthesia and negative IV test  Additional Notes The patient has requested an epidural for labor pain management. Risks and benefits including, but not limited to, infection, bleeding, local anesthetic toxicity, headache, hypotension, back pain, block failure, etc. were discussed with the patient. The patient expressed understanding and consented to the procedure. I confirmed that the patient has no bleeding disorders and is not taking blood thinners. I confirmed the patient's last platelet count with the nurse. A time-out was performed immediately prior to the procedure. Please see nursing documentation for vital signs. Sterile technique was used throughout the whole procedure. Once LOR achieved, the epidural catheter threaded easily without resistance. Aspiration of the catheter was negative for blood and CSF. The epidural was dosed slowly and an infusion was started.  1 attempt(s)Reason for block:procedure for pain

## 2023-06-21 NOTE — H&P (Signed)
 OB ADMISSION/ HISTORY & PHYSICAL:  Admission Date: 06/21/2023 11:29 AM  Admit Diagnosis: Normal labor [O80, Z37.9]    Hannah Clark is a 28 y.o. female G2P1001 at [redacted]w[redacted]d presenting for early labor. Seen in the MAU early this AM for a labor check. Did not make change over 4 hours and was sent home. Presented to the office this AM reporting continued contractions. SVE 3/90/-2 and decision made to direct admit for labor. Strongly desires TOLAC. Denies leaking of fluid or vaginal bleeding. Endorses + fetal movement. Husband, Larnell, present and supportive. Eagerly anticipating a baby girl Caretta.   Prenatal History: G2P1001   EDC: 06/20/2023 Prenatal care at [facility] since 10 weeks  Primary: M. 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, recently seen by [facility] and [facility] for right sided breast changes, has plan for close PP F/U Persistent nausea and vomiting, taking Zofran   History of anxiety, plans to start Lexapro  PP  Prenatal Labs: ABO, Rh:   O POS Antibody: Negative (05/03 0000) Rubella: Immune (06/11 0000)  RPR: Nonreactive (06/11 0000)  HBsAg: Negative (06/11 0000)  HIV: Non-reactive (06/11 0000)  GBS: Negative/-- (12/11 0000)  1 hr Glucola : 95 Genetic Screening: Low risk Panorama XX Ultrasound: normal XX anatomy, anterior placenta, AGA growth 68% at 36 weeks    Maternal Diabetes: No Genetic Screening: Normal Maternal Ultrasounds/Referrals: Normal Fetal Ultrasounds or other Referrals:  None Maternal Substance Abuse:  No Significant Maternal Medications:  None Significant Maternal Lab Results:  Group B Strep negative Other Comments:  None  Medical / Surgical History : Past medical history:  Past Medical History:  Diagnosis Date   Anxiety    Asthma    Depression    Headache     Past surgical history:  Past Surgical History:   Procedure Laterality Date   CESAREAN SECTION N/A 10/12/2019   Procedure: Primary CESAREAN SECTION;  Surgeon: Kandyce Sor, MD;  Location: [facility] LD ORS;  Service: Obstetrics;  Laterality: N/A;  EDD: 10/17/19   CYSTECTOMY     leg   TONSILLECTOMY     WISDOM TOOTH EXTRACTION      Family History:  Family History  Problem Relation Age of Onset   Hypertension Mother    Depression Mother    Fibromyalgia Mother    Diverticulitis Mother    Migraines Mother    Diabetes Mother    Hypertension Father    Diverticulitis Father    Heart disease Father    Heart attack Father    Depression Maternal Grandmother    Diabetes Maternal Grandmother    Fibromyalgia Maternal Grandmother    Cirrhosis Maternal Grandmother    Hypertension Paternal Grandmother    Dementia Paternal Grandmother    Heart attack Paternal Grandfather     Social History:  reports that she has never smoked. She has never used smokeless tobacco. She reports that she does not drink alcohol and does not use drugs.  Allergies: Penicillins, Meat [alpha-gal], Amoxicillin, Aviane [levonorgestrel-ethinyl estrad], and Erythromycin   Current Medications at time of admission:  Medications Prior to Admission  Medication Sig Dispense Refill Last Dose/Taking   acetaminophen  (TYLENOL ) 500 MG tablet Take 2 tablets (1,000 mg total) by mouth every 6 (six) hours as needed for mild pain. 30 tablet 0 Past Week   cetirizine (ZYRTEC ALLERGY ) 10 MG tablet Take 10 mg by mouth daily.   Past Month  ferrous sulfate 325 (65 FE) MG EC tablet Take 325 mg by mouth 3 (three) times daily with meals.   06/20/2023 Evening   LO LOESTRIN FE 1 MG-10 MCG / 10 MCG tablet Take 1 tablet by mouth daily.   06/20/2023 Evening   Misc Natural Products (PRO HERBS BREAST HEALTH PO) Take by mouth.   06/20/2023 Morning   pantoprazole (PROTONIX) 20 MG tablet Take 20 mg by mouth daily.   06/20/2023 Evening   Prenatal Multivit-Min-Fe-FA (PRE-NATAL FORMULA PO) Take 1 tablet by mouth daily  at 12 noon. Nature made   06/20/2023 Evening   EPINEPHrine  0.3 mg/0.3 mL IJ SOAJ injection Inject 0.3 mg into the muscle as needed for anaphylaxis. 1 each 2 Unknown   psyllium (METAMUCIL) 58.6 % powder Take 1 packet by mouth 3 (three) times daily.       Review of Systems: Review of Systems  All other systems reviewed and are negative.  Physical Exam: Vital signs and nursing notes reviewed.  Patient Vitals for the past 24 hrs:  BP Temp Temp src Pulse Resp SpO2  06/21/23 1145 -- -- -- -- -- 100 %  06/21/23 1144 130/81 98.1 F (36.7 C) Oral (!) 117 16 --    General: AAO x 3, NAD Heart: RRR Lungs:CTAB Abdomen: Gravid, NT Extremities: no edema SVE:   3/90/-2, IBOW, in the office this AM  FHR: 140BPM, moderate variability, + accels, no decels TOCO: Contractions 2-3 minutes apart  Labs:   No results for input(s): WBC, HGB, HCT, PLT in the last 72 hours.  Assessment/Plan: 28 y.o. G2P1001 at [redacted]w[redacted]d, early labor, strongly desires TOLAC History of PLTCS for breech presentation History of anxiety  Fetal wellbeing - FHT category 1 EFW AGA 7-8lbs  Labor: Plan AROM, Pitocin  prn, continuous EFM per ACOG for TOLAC  GBS negative Rubella immune Rh positive  Pain control: desires unmedicated birth Analgesia/anesthesia PRN  Anticipated MOD: Working towards VBAC  Plans to breastfeed. POC discussed with patient and support team, all questions answered.  Dr. Fonnie notified of admission/plan of care.  Alan MARLA Molt CNM, MSN 06/21/2023, 12:12 PM

## 2023-06-21 NOTE — Anesthesia Preprocedure Evaluation (Signed)
 Anesthesia Evaluation  Patient identified by MRN, date of birth, ID band Patient awake    Reviewed: Allergy  & Precautions, NPO status , Patient's Chart, lab work & pertinent test results  History of Anesthesia Complications Negative for: history of anesthetic complications  Airway Mallampati: III  TM Distance: >3 FB Neck ROM: Full    Dental   Pulmonary asthma    Pulmonary exam normal breath sounds clear to auscultation       Cardiovascular negative cardio ROS  Rhythm:Regular Rate:Normal     Neuro/Psych  Headaches, neg Seizures PSYCHIATRIC DISORDERS Anxiety Depression       GI/Hepatic Neg liver ROS,GERD  Medicated,,  Endo/Other  negative endocrine ROS    Renal/GU negative Renal ROS     Musculoskeletal   Abdominal   Peds  Hematology negative hematology ROS (+) Lab Results      Component                Value               Date                      WBC                      11.5 (H)            06/21/2023                HGB                      12.7                06/21/2023                HCT                      39.0                06/21/2023                MCV                      78.0 (L)            06/21/2023                PLT                      194                 06/21/2023              Anesthesia Other Findings TOLAC  Reproductive/Obstetrics (+) Pregnancy                              Anesthesia Physical Anesthesia Plan  ASA: 2  Anesthesia Plan: Epidural   Post-op Pain Management:    Induction:   PONV Risk Score and Plan:   Airway Management Planned: Natural Airway  Additional Equipment:   Intra-op Plan:   Post-operative Plan:   Informed Consent: I have reviewed the patients History and Physical, chart, labs and discussed the procedure including the risks, benefits and alternatives for the proposed anesthesia with the patient or authorized representative who has  indicated his/her understanding and acceptance.       Plan Discussed with: Anesthesiologist  Anesthesia Plan Comments: (I have discussed risks of neuraxial anesthesia including but not limited to infection, bleeding, nerve injury, back pain, headache, seizures, and failure of block. Patient denies bleeding disorders and is not currently anticoagulated. Labs have been reviewed. Risks and benefits discussed. All patient's questions answered.  )         Anesthesia Quick Evaluation

## 2023-06-21 NOTE — MAU Note (Addendum)
.  Hannah Clark is a 28 y.o. at [redacted]w[redacted]d here in MAU reporting: ongoing contractions since yesterday. Pt seen in MAU early this am and was sent home at 1cm. Pt was seen in the office today and was sent as a direct admit to L&D. Pt cervix checked in the office by A. Joshua CNM and was 3/90/vertex. Denies any LOF. Reports bloody mucus and +FM.  TOLAC, previous c/s for breech presentation.   Contractions every: 3-5 minutes Onset of ctx: ongoing  ROM: Intact  Vaginal Bleeding: Denies, mucus  Last SVE: 3/90/vertex in office  Labor Pain Management Plan: IV pain medication and Nitrous Oxide  Fetal Movement: Reports positive FM FHT: 135 OB Office: [place] GBS: Negative Lab orders placed from triage:  Admission orders to L&D

## 2023-06-21 NOTE — Progress Notes (Signed)
 Labor Progress Note  S: Patient reports feeling painful contractions, more comfortable after IV fentanyl .   O:  Vitals:   06/21/23 1222 06/21/23 1300  BP: 124/73   Pulse: 99   Resp: 16 18  Temp:    SpO2:      SVE: 4.5/90/-1 at 1613 by RN Jenny  EFM: baseline 130 bpm, moderate variability, + accels, - decels Toco: ctxs q3-28min, difficult to trace consistently given patient frequent movement  A/P: 28 y.o. G2P1001 at [redacted]w[redacted]d with h/o LTCS x1 for breech, anxiety admitted for TOLAC. Patient in early labor.   #TOLAC - S/p AROM at 1230p for clear fluid - Expectant management - Anticipate vaginal delivery   #Fetus - EFM: Cat I; resuscitate per protocol PRN   #Pain: - Epidural PRN, patient currently declining Conversation w/patient about risks and benefits of epidural. Advised that I typically recommend epidural for TOLAC patients, but that decision is her own. Discussed that given prior CS pt at relatively higher risk of needing emergency C/S (although absolute risk is still low). If pt does not have epidural, may need to have general anesthesia for emergency CS which puts patient and fetus at higher risk. Explained that with epidural in situ, patient will not be able to walk around or get out of bed. Patient currently controlling pain with IV fentanyl . Advised patient we can continue this until she becomes more active, at that point it is not safe to administer IV narcotics due to risk of neonatal respiratory depression. Patient understands and will think about epidural and let team know if she would like one.    #GBS:  - Negative/-- (12/11 0000)  Hadassah Springer, MD  Will d/w Alan Molt, CNM primary provider

## 2023-06-21 NOTE — Progress Notes (Signed)
 S: Comfortable with epidural. Husband, Joe, present and supportive.   O: Vitals:   06/21/23 1835 06/21/23 1840 06/21/23 1845 06/21/23 1850  BP: 123/79 120/64 111/71 118/79  Pulse: 98 (!) 109 (!) 121 (!) 114  Resp:      Temp:      TempSrc:      SpO2:       FHT:  FHR: 125 bpm, variability: moderate,  accelerations:  Present,  decelerations:  Absent UC:   regular, every 2-3 minutes SVE:   Dilation: 4.5 Effacement (%): 90 Station: -2 Exam by: A. Joshua, CNM  IUPC placed without difficulty.  A / P: Augmentation of labor, progressing well  Fetal Wellbeing:  Category I Pain Control:  Epidural Anticipated MOD:   VBAC  Will start Pitocin  2x2 until adequate MVUs. Encourage frequent position changes to facilitate fetal rotation and descent.   Alan MARLA Joshua, CNM, MSN 06/21/2023, 7:53 PM

## 2023-06-22 ENCOUNTER — Encounter (HOSPITAL_COMMUNITY): Payer: Self-pay | Admitting: Obstetrics

## 2023-06-22 ENCOUNTER — Inpatient Hospital Stay (HOSPITAL_COMMUNITY)
Admission: RE | Admit: 2023-06-22 | Payer: Commercial Managed Care - PPO | Source: Home / Self Care | Admitting: Family Medicine

## 2023-06-22 ENCOUNTER — Inpatient Hospital Stay (HOSPITAL_COMMUNITY): Payer: Commercial Managed Care - PPO

## 2023-06-22 DIAGNOSIS — O34219 Maternal care for unspecified type scar from previous cesarean delivery: Secondary | ICD-10-CM | POA: Diagnosis present

## 2023-06-22 DIAGNOSIS — S3141XA Laceration without foreign body of vagina and vulva, initial encounter: Secondary | ICD-10-CM | POA: Diagnosis not present

## 2023-06-22 DIAGNOSIS — F419 Anxiety disorder, unspecified: Secondary | ICD-10-CM | POA: Diagnosis present

## 2023-06-22 LAB — CBC WITH DIFFERENTIAL/PLATELET
Abs Immature Granulocytes: 0.21 10*3/uL — ABNORMAL HIGH (ref 0.00–0.07)
Basophils Absolute: 0 10*3/uL (ref 0.0–0.1)
Basophils Relative: 0 %
Eosinophils Absolute: 0 10*3/uL (ref 0.0–0.5)
Eosinophils Relative: 0 %
HCT: 26.7 % — ABNORMAL LOW (ref 36.0–46.0)
Hemoglobin: 9 g/dL — ABNORMAL LOW (ref 12.0–15.0)
Immature Granulocytes: 1 %
Lymphocytes Relative: 7 %
Lymphs Abs: 1.7 10*3/uL (ref 0.7–4.0)
MCH: 26.1 pg (ref 26.0–34.0)
MCHC: 33.7 g/dL (ref 30.0–36.0)
MCV: 77.4 fL — ABNORMAL LOW (ref 80.0–100.0)
Monocytes Absolute: 1.3 10*3/uL — ABNORMAL HIGH (ref 0.1–1.0)
Monocytes Relative: 5 %
Neutro Abs: 21.4 10*3/uL — ABNORMAL HIGH (ref 1.7–7.7)
Neutrophils Relative %: 87 %
Platelets: 136 10*3/uL — ABNORMAL LOW (ref 150–400)
RBC: 3.45 MIL/uL — ABNORMAL LOW (ref 3.87–5.11)
RDW: 14.7 % (ref 11.5–15.5)
WBC: 24.6 10*3/uL — ABNORMAL HIGH (ref 4.0–10.5)
nRBC: 0 % (ref 0.0–0.2)

## 2023-06-22 LAB — CBC
HCT: 27.6 % — ABNORMAL LOW (ref 36.0–46.0)
Hemoglobin: 9.1 g/dL — ABNORMAL LOW (ref 12.0–15.0)
MCH: 25.6 pg — ABNORMAL LOW (ref 26.0–34.0)
MCHC: 33 g/dL (ref 30.0–36.0)
MCV: 77.5 fL — ABNORMAL LOW (ref 80.0–100.0)
Platelets: 143 10*3/uL — ABNORMAL LOW (ref 150–400)
RBC: 3.56 MIL/uL — ABNORMAL LOW (ref 3.87–5.11)
RDW: 14.4 % (ref 11.5–15.5)
WBC: 21.4 10*3/uL — ABNORMAL HIGH (ref 4.0–10.5)
nRBC: 0 % (ref 0.0–0.2)

## 2023-06-22 LAB — FIBRINOGEN: Fibrinogen: 466 mg/dL (ref 210–475)

## 2023-06-22 LAB — PROTIME-INR
INR: 1.2 (ref 0.8–1.2)
Prothrombin Time: 14.9 s (ref 11.4–15.2)

## 2023-06-22 LAB — APTT: aPTT: 29 s (ref 24–36)

## 2023-06-22 MED ORDER — SODIUM CHLORIDE 0.9 % IV SOLN: INTRAVENOUS | Status: AC | PRN

## 2023-06-22 MED ORDER — ZOLPIDEM TARTRATE 5 MG PO TABS: 5.0000 mg | ORAL_TABLET | Freq: Every evening | ORAL | Status: DC | PRN

## 2023-06-22 MED ORDER — PRENATAL MULTIVITAMIN CH
1.0000 | ORAL_TABLET | Freq: Every day | ORAL | Status: DC
Start: 2023-06-22 — End: 2023-06-23
  Administered 2023-06-22 – 2023-06-23 (×2): 1 via ORAL
  Filled 2023-06-22 (×2): qty 1

## 2023-06-22 MED ORDER — METHYLERGONOVINE MALEATE 0.2 MG PO TABS
0.2000 mg | ORAL_TABLET | Freq: Four times a day (QID) | ORAL | Status: AC
  Administered 2023-06-22 (×4): 0.2 mg via ORAL
  Filled 2023-06-22 (×4): qty 1

## 2023-06-22 MED ORDER — IRON SUCROSE 500 MG IVPB - SIMPLE MED
500.0000 mg | Freq: Once | INTRAVENOUS | Status: AC
  Filled 2023-06-22: qty 275

## 2023-06-22 MED ORDER — SENNOSIDES-DOCUSATE SODIUM 8.6-50 MG PO TABS
2.0000 | ORAL_TABLET | Freq: Every day | ORAL | Status: DC
  Administered 2023-06-23: 2 via ORAL
  Filled 2023-06-22: qty 2

## 2023-06-22 MED ORDER — METHYLERGONOVINE MALEATE 0.2 MG/ML IJ SOLN: 0.2000 mg | Freq: Once | INTRAMUSCULAR | Status: DC

## 2023-06-22 MED ORDER — SIMETHICONE 80 MG PO CHEW: 80.0000 mg | CHEWABLE_TABLET | ORAL | Status: DC | PRN

## 2023-06-22 MED ORDER — DIPHENHYDRAMINE HCL 25 MG PO CAPS: 25.0000 mg | ORAL_CAPSULE | Freq: Four times a day (QID) | ORAL | Status: DC | PRN

## 2023-06-22 MED ORDER — ALBUTEROL SULFATE (2.5 MG/3ML) 0.083% IN NEBU: 2.5000 mg | INHALATION_SOLUTION | Freq: Once | RESPIRATORY_TRACT | Status: DC | PRN

## 2023-06-22 MED ORDER — ACETAMINOPHEN 10 MG/ML IV SOLN
1000.0000 mg | Freq: Once | INTRAVENOUS | Status: AC
  Administered 2023-06-22: 1000 mg via INTRAVENOUS
  Filled 2023-06-22: qty 100

## 2023-06-22 MED ORDER — ONDANSETRON HCL 4 MG/2ML IJ SOLN: 4.0000 mg | INTRAMUSCULAR | Status: DC | PRN

## 2023-06-22 MED ORDER — TETANUS-DIPHTH-ACELL PERTUSSIS 5-2.5-18.5 LF-MCG/0.5 IM SUSY
0.5000 mL | PREFILLED_SYRINGE | Freq: Once | INTRAMUSCULAR | Status: DC
Start: 2023-06-23 — End: 2023-06-23

## 2023-06-22 MED ORDER — METHYLERGONOVINE MALEATE 0.2 MG/ML IJ SOLN
INTRAMUSCULAR | Status: AC
  Administered 2023-06-22: 0.2 mg
  Filled 2023-06-22: qty 1

## 2023-06-22 MED ORDER — SODIUM CHLORIDE 0.9 % IV SOLN
8.0000 mg | Freq: Once | INTRAVENOUS | Status: DC
  Filled 2023-06-22: qty 4

## 2023-06-22 MED ORDER — IBUPROFEN 600 MG PO TABS
600.0000 mg | ORAL_TABLET | Freq: Four times a day (QID) | ORAL | Status: DC
  Administered 2023-06-22 – 2023-06-23 (×6): 600 mg via ORAL
  Filled 2023-06-22 (×6): qty 1

## 2023-06-22 MED ORDER — SODIUM CHLORIDE 0.9 % IV SOLN
500.0000 mg | INTRAVENOUS | Status: DC
  Administered 2023-06-22: 500 mg via INTRAVENOUS
  Filled 2023-06-22: qty 25

## 2023-06-22 MED ORDER — BENZOCAINE-MENTHOL 20-0.5 % EX AERO
1.0000 | INHALATION_SPRAY | CUTANEOUS | Status: DC | PRN
  Administered 2023-06-22 – 2023-06-23 (×2): 1 via TOPICAL
  Filled 2023-06-22 (×2): qty 56

## 2023-06-22 MED ORDER — ONDANSETRON HCL 4 MG/2ML IJ SOLN
INTRAMUSCULAR | Status: AC
  Filled 2023-06-22: qty 4

## 2023-06-22 MED ORDER — DIBUCAINE (PERIANAL) 1 % EX OINT: 1.0000 | TOPICAL_OINTMENT | CUTANEOUS | Status: DC | PRN

## 2023-06-22 MED ORDER — SODIUM CHLORIDE 0.9 % IV BOLUS: 500.0000 mL | Freq: Once | INTRAVENOUS | Status: DC | PRN

## 2023-06-22 MED ORDER — AMMONIA AROMATIC IN INHA
RESPIRATORY_TRACT | Status: AC
  Filled 2023-06-22: qty 10

## 2023-06-22 MED ORDER — ACETAMINOPHEN 325 MG PO TABS
650.0000 mg | ORAL_TABLET | ORAL | Status: DC | PRN
  Administered 2023-06-22 – 2023-06-23 (×4): 650 mg via ORAL
  Filled 2023-06-22 (×4): qty 2

## 2023-06-22 MED ORDER — FLUTICASONE PROPIONATE 50 MCG/ACT NA SUSP
2.0000 | Freq: Every day | NASAL | Status: DC
  Administered 2023-06-22 – 2023-06-23 (×2): 2 via NASAL
  Filled 2023-06-22: qty 16

## 2023-06-22 MED ORDER — ONDANSETRON HCL 4 MG PO TABS
4.0000 mg | ORAL_TABLET | ORAL | Status: DC | PRN
Start: 2023-06-22 — End: 2023-06-23

## 2023-06-22 MED ORDER — SODIUM CHLORIDE 0.9 % IV SOLN
25.0000 mg | Freq: Once | INTRAVENOUS | Status: DC
  Filled 2023-06-22: qty 1

## 2023-06-22 MED ORDER — DIPHENHYDRAMINE HCL 50 MG/ML IJ SOLN
25.0000 mg | Freq: Once | INTRAMUSCULAR | Status: DC | PRN
Start: 2023-06-22 — End: 2023-06-23

## 2023-06-22 MED ORDER — METHYLPREDNISOLONE SODIUM SUCC 125 MG IJ SOLR: 125.0000 mg | Freq: Once | INTRAMUSCULAR | Status: DC | PRN

## 2023-06-22 MED ORDER — WITCH HAZEL-GLYCERIN EX PADS
1.0000 | MEDICATED_PAD | CUTANEOUS | Status: DC | PRN
  Administered 2023-06-23 (×2): 1 via TOPICAL

## 2023-06-22 MED ORDER — PSEUDOEPHEDRINE HCL ER 120 MG PO TB12
120.0000 mg | ORAL_TABLET | Freq: Two times a day (BID) | ORAL | Status: DC
  Filled 2023-06-22: qty 1

## 2023-06-22 MED ORDER — ESCITALOPRAM OXALATE 10 MG PO TABS
5.0000 mg | ORAL_TABLET | Freq: Every day | ORAL | Status: DC
Start: 2023-06-22 — End: 2023-06-23
  Administered 2023-06-22 – 2023-06-23 (×2): 5 mg via ORAL
  Filled 2023-06-22 (×2): qty 1

## 2023-06-22 MED ORDER — CEFAZOLIN SODIUM-DEXTROSE 2-4 GM/100ML-% IV SOLN
2.0000 g | Freq: Once | INTRAVENOUS | Status: AC
  Administered 2023-06-22: 2 g via INTRAVENOUS
  Filled 2023-06-22: qty 100

## 2023-06-22 MED ORDER — COCONUT OIL OIL: 1.0000 | TOPICAL_OIL | Status: DC | PRN

## 2023-06-22 MED ORDER — TRANEXAMIC ACID-NACL 1000-0.7 MG/100ML-% IV SOLN
INTRAVENOUS | Status: AC
  Administered 2023-06-22: 1000 mg
  Filled 2023-06-22: qty 100

## 2023-06-22 MED ORDER — EPINEPHRINE 0.3 MG/0.3ML IJ SOAJ: 0.3000 mg | Freq: Once | INTRAMUSCULAR | Status: DC | PRN

## 2023-06-22 NOTE — Lactation Note (Signed)
 This note was copied from a baby's chart. Lactation Consultation Note  Patient Name: Hannah Clark Unijb'd Date: 06/22/2023 Age:29 hours Reason for consult: Initial assessment;Term  P2- [name] reports that she has formula fed only since infant was born because she has not been feeling well. [name]'s plan is to offer both breast milk and formula, like she did with her first child. [name] has a hx of recurrent mastitis on the right breast only. [name] is being followed closely with an outpatient LC and the breast center. [name] told LC that her outpatient LC recommended not stimulating her right breast at all and only feed from her left breast. [name] is still unsure if this is what she plans to do. LC reviewed how that could affect her supply and her breasts. [name] verbalized understanding. During this consult, it was time for infant's next feeding but [name] reports still not feeling well. [name]'s mother bottle fed infant instead.  [name] requested a manual pump. LC sized her at a 21 mm flange. LC reviewed how to set up the pump, how to clean it and how to work it. [name] denied wanting to use a DEBP at this time. LC reviewed feeding infant on cue 8-12x in 24 hrs, not allowing infant to go over 3 hrs without a feeding, CDC milk storage guidelines and LC services handout. LC encouraged [name] to call for further assistance as needed.  Maternal Data Has patient been taught Hand Expression?: No Does the patient have breastfeeding experience prior to this delivery?: Yes How long did the patient breastfeed?: 1.5 years  Feeding Mother's Current Feeding Choice: Breast Milk and Formula Nipple Type: Slow - flow  Lactation Tools Discussed/Used Tools: Pump;Flanges Flange Size: 21 Breast pump type: Manual Pump Education: Setup, frequency, and cleaning;Milk Storage Reason for Pumping: [name] request Pumping frequency: 15-20 min every 3 hrs as needed  Interventions Interventions: Breast feeding basics reviewed;Hand pump;Education;LC  Services brochure  Discharge Discharge Education: Engorgement and breast care;Warning signs for feeding baby Pump: Manual;Hands Free;Personal  Consult Status Consult Status: Follow-up Date: 06/23/23 Follow-up type: In-patient    Recardo Hoit BS, IBCLC 06/22/2023, 5:23 PM

## 2023-06-22 NOTE — Anesthesia Postprocedure Evaluation (Signed)
 Anesthesia Post Note  Patient: Hannah Clark  Procedure(s) Performed: AN AD HOC LABOR EPIDURAL     Patient location during evaluation: Mother Baby Anesthesia Type: Epidural Level of consciousness: awake, oriented and awake and alert Pain management: pain level controlled Vital Signs Assessment: post-procedure vital signs reviewed and stable Respiratory status: spontaneous breathing, respiratory function stable and nonlabored ventilation Cardiovascular status: stable Postop Assessment: no headache, adequate PO intake, able to ambulate, patient able to bend at knees and no apparent nausea or vomiting Anesthetic complications: no   No notable events documented.  Last Vitals:  Vitals:   06/22/23 0700 06/22/23 1130  BP: 96/64   Pulse: (!) 101   Resp: 17 18  Temp: 36.6 C (!) 36.4 C  SpO2: 99%     Last Pain:  Vitals:   06/22/23 1130  TempSrc: Oral  PainSc:    Pain Goal:                   [name]

## 2023-06-22 NOTE — Progress Notes (Signed)
 Contact Note  Called to bedside by RN to assess ongoing vaginal bleeding. Patient now 3h s/p vaginal delivery c/b PPH w/EBL 1L s/p TXA and methergine . Vaginal packing placed for friable vaginal tissue - 2nd degree laceration repaired. Foley in situ. Intermittent uterine atony with slow trickle.   Vitals:   06/22/23 0345 06/22/23 0400  BP: 104/64 (!) 102/59  Pulse: (!) 115 (!) 108  Resp:    Temp:    SpO2:     Plan for 500cc bolus, stat labs. Will start PO methergine  series and continue with vaginal packing.   D'Iorio, MD

## 2023-06-23 LAB — CBC WITH DIFFERENTIAL/PLATELET
Abs Immature Granulocytes: 0.13 10*3/uL — ABNORMAL HIGH (ref 0.00–0.07)
Basophils Absolute: 0 10*3/uL (ref 0.0–0.1)
Basophils Relative: 0 %
Eosinophils Absolute: 0.2 10*3/uL (ref 0.0–0.5)
Eosinophils Relative: 1 %
HCT: 24.6 % — ABNORMAL LOW (ref 36.0–46.0)
Hemoglobin: 7.9 g/dL — ABNORMAL LOW (ref 12.0–15.0)
Immature Granulocytes: 1 %
Lymphocytes Relative: 12 %
Lymphs Abs: 1.8 10*3/uL (ref 0.7–4.0)
MCH: 25.9 pg — ABNORMAL LOW (ref 26.0–34.0)
MCHC: 32.1 g/dL (ref 30.0–36.0)
MCV: 80.7 fL (ref 80.0–100.0)
Monocytes Absolute: 1 10*3/uL (ref 0.1–1.0)
Monocytes Relative: 7 %
Neutro Abs: 11.7 10*3/uL — ABNORMAL HIGH (ref 1.7–7.7)
Neutrophils Relative %: 79 %
Platelets: 139 10*3/uL — ABNORMAL LOW (ref 150–400)
RBC: 3.05 MIL/uL — ABNORMAL LOW (ref 3.87–5.11)
RDW: 15.4 % (ref 11.5–15.5)
WBC: 14.9 10*3/uL — ABNORMAL HIGH (ref 4.0–10.5)
nRBC: 0 % (ref 0.0–0.2)

## 2023-06-23 MED ORDER — METOCLOPRAMIDE HCL 10 MG PO TABS
10.0000 mg | ORAL_TABLET | Freq: Once | ORAL | Status: AC
  Administered 2023-06-23: 10 mg via ORAL
  Filled 2023-06-23: qty 1

## 2023-06-23 MED ORDER — ESCITALOPRAM OXALATE 5 MG PO TABS: 5.0000 mg | ORAL_TABLET | Freq: Every day | ORAL | 3 refills | Status: AC

## 2023-06-23 MED ORDER — MAGNESIUM OXIDE -MG SUPPLEMENT 400 (240 MG) MG PO TABS
400.0000 mg | ORAL_TABLET | Freq: Once | ORAL | Status: AC
  Administered 2023-06-23: 400 mg via ORAL
  Filled 2023-06-23: qty 1

## 2023-06-23 MED ORDER — IBUPROFEN 600 MG PO TABS: 600.0000 mg | ORAL_TABLET | Freq: Four times a day (QID) | ORAL | Status: DC

## 2023-06-23 NOTE — Discharge Instructions (Signed)
 Congratulations! We hope you have a wonderful postpartum period. We will plan to see you in the office in 6 weeks. Please call the office if you experience fevers (>100.4 F), heavy vaginal bleeding (using >2 pads/hour), severe pain not responsive to ibuprofen (Advil or Motrin) and acetaminophen (Tylenol), chest pain, shortness of breath, or if you have pain, redness, or swelling in one or both legs. Mood swings, fatigue, and feeling down can be normal in the first two weeks following birth. If you feel your mood symptoms are severe or lasting longer than two weeks, please call our office. If you have any thoughts of hurting yourself or others please call our office. Please call your pediatrician if you have any concerns about your baby.

## 2023-06-23 NOTE — Progress Notes (Signed)
 [name] was referred for history of anxiety.  * Referral screened out by Clinical Social Worker because none of the following criteria appear to apply:  ~ History of anxiety during this pregnancy, or of post-partum depression following prior delivery.  ~ Diagnosis of anxiety within last 3 years  OR  * [name]'s symptoms currently being treated with medication and/or  therapy.  Per OB notes, [name] is prescribed Lexapro 5mg  for support.  Please contact the Clinical Social Worker if needs arise, by [facility] request, or if [name] scores greater than 9/yes to question 10 on Edinburgh Postpartum Depression Screen.  Enos Fling, Theresia Majors Clinical Social Worker [phone]

## 2023-06-23 NOTE — Discharge Summary (Addendum)
 Postpartum Discharge Summary     Patient Name: Hannah Clark DOB: 1995-12-18 MRN: [id number]  Date of admission: 06/21/2023 Delivery date:06/22/2023 Delivering provider: JOSHUA PALMA K Date of discharge: 06/23/2023  Admitting diagnosis: Normal labor [O80, Z37.9] Intrauterine pregnancy: [redacted]w[redacted]d     Secondary diagnosis:  Principal Problem:   Postpartum care following vaginal delivery 1/9 Active Problems:   Normal labor   VBAC (vaginal birth after Cesarean)   Vaginal laceration   Anxiety  Additional problems: Postpartum hemorrhage, clinically significant acute blood loss anemia    Discharge diagnosis: Term Pregnancy Delivered, VBAC, and PPH                                              Post partum procedures: none Augmentation: AROM Complications: Hemorrhage>1081mL  Hospital course: Onset of Labor With Vaginal Delivery      28 y.o. yo [id number] at [redacted]w[redacted]d was admitted in Latent Labor on 06/21/2023. Labor course was complicated complicated by prior Cesarean section  Membrane Rupture Time/Date: 12:30 PM,06/21/2023  Delivery Method:VBAC, Spontaneous Operative Delivery:N/A Episiotomy: None Lacerations:  2nd degree;Vaginal Patient had a postpartum course complicated by hemorrhage.  She is ambulating, tolerating a regular diet, passing flatus, and urinating well. Patient is discharged home in stable condition on 06/23/23.  Newborn Data: Birth date:06/22/2023 Birth time:1:29 AM Gender:Female Living status:Living Apgars:9 ,9  Weight:3450 g  Magnesium  Sulfate received: No BMZ received: No Rhophylac:N/A MMR:No T-DaP:Given postpartum Flu: No RSV Vaccine received: No Transfusion:No Immunizations administered: Immunization History  Administered Date(s) Administered   MMR 10/15/2019    Physical exam  Vitals:   06/22/23 0700 06/22/23 1130 06/22/23 1424 06/22/23 1931  BP: 96/64  106/60 (!) 102/56  Pulse: (!) 101  92 86  Resp: 17 18 18 18   Temp: 97.9 F (36.6 C) (!) 97.5 F (36.4 C) 97.8  F (36.6 C) 98.1 F (36.7 C)  TempSrc: Oral Oral Oral Oral  SpO2: 99%   99%   General: alert, cooperative, and no distress Lochia: appropriate Uterine Fundus: firm Incision: N/A DVT Evaluation: No evidence of DVT seen on physical exam. Labs: Lab Results  Component Value Date   WBC 24.6 (H) 06/22/2023   HGB 9.0 (L) 06/22/2023   HCT 26.7 (L) 06/22/2023   MCV 77.4 (L) 06/22/2023   PLT 136 (L) 06/22/2023       No data to display         Edinburgh Score:    06/22/2023    7:32 PM  Edinburgh Postnatal Depression Scale Screening Tool  I have been able to laugh and see the funny side of things. 0  I have looked forward with enjoyment to things. 0  I have blamed myself unnecessarily when things went wrong. 1  I have been anxious or worried for no good reason. 1  I have felt scared or panicky for no good reason. 0  Things have been getting on top of me. 1  I have been so unhappy that I have had difficulty sleeping. 0  I have felt sad or miserable. 1  I have been so unhappy that I have been crying. 1  The thought of harming myself has occurred to me. 0  Edinburgh Postnatal Depression Scale Total 5      After visit meds:  Allergies as of 06/23/2023       Reactions   Penicillins Hives, Nausea And Vomiting,  Rash   Childhood Pt states received PCN with last pregnancy and broke out in hives and lips were swollen.   Meat [alpha-gal] Diarrhea, Nausea And Vomiting   Red meat and Pork Bodyache   Amoxicillin Nausea And Vomiting   Aviane [levonorgestrel-ethinyl Estrad]    Worsened PMS symptoms, SE   Erythromycin Nausea Only        Medication List     STOP taking these medications    Lo Loestrin Fe 1 MG-10 MCG / 10 MCG tablet Generic drug: Norethindrone-Ethinyl Estradiol-Fe Biphas       TAKE these medications    acetaminophen  500 MG tablet Commonly known as: TYLENOL  Take 2 tablets (1,000 mg total) by mouth every 6 (six) hours as needed for mild pain.    EPINEPHrine  0.3 mg/0.3 mL Soaj injection Commonly known as: EPI-PEN Inject 0.3 mg into the muscle as needed for anaphylaxis.   escitalopram  5 MG tablet Commonly known as: LEXAPRO  Take 1 tablet (5 mg total) by mouth daily. Start taking on: June 24, 2023   ferrous sulfate 325 (65 FE) MG EC tablet Take 325 mg by mouth 3 (three) times daily with meals.   ibuprofen  600 MG tablet Commonly known as: ADVIL  Take 1 tablet (600 mg total) by mouth every 6 (six) hours.   pantoprazole 20 MG tablet Commonly known as: PROTONIX Take 20 mg by mouth daily.   PRE-NATAL FORMULA PO Take 1 tablet by mouth daily at 12 noon. Nature made   PRO HERBS BREAST HEALTH PO Take by mouth.   psyllium 58.6 % powder Commonly known as: METAMUCIL Take 1 packet by mouth 3 (three) times daily.   ZyrTEC Allergy  10 MG tablet Generic drug: cetirizine Take 10 mg by mouth daily.         Discharge home in stable condition Infant Feeding: Bottle and Breast Infant Disposition:home with mother Discharge instruction: per After Visit Summary and Postpartum booklet. Activity: Advance as tolerated. Pelvic rest for 6 weeks.  Diet: routine diet Anticipated Birth Control: Unsure Postpartum Appointment:6 weeks Additional Postpartum F/U:  IV iron  infusion in 1 week  Future Appointments:No future appointments. Follow up Visit:  Follow-up Information     Joshua Alan POUR, CNM. Schedule an appointment as soon as possible for a visit in 6 week(s).   Specialty: Certified Nurse Midwife Contact information: [address] [place] [address] [phone]                     06/23/2023 Hadassah DELENA Springer, MD

## 2023-06-23 NOTE — Lactation Note (Signed)
 This note was copied from a baby's chart. Lactation Consultation Note  Patient Name: Hannah Clark Date: 06/23/2023 Age:29 hours Reason for consult: Follow-up assessment;Infant weight loss;Term (attempted to see dyad. mom, dad, and baby asleep. Grandmother holding older child.  5 % weight loss) Baby breast and formula. Many bottles. Output QS.    Feeding Mother's Current Feeding Choice: Breast Milk and Formula Nipple Type: Slow - flow   Lactation Tools Discussed/Used    Interventions    Discharge Discharge Education: Engorgement and breast care;Warning signs for feeding baby;Other (comment) (had been reviewed by previous LC) Pump: Hands Free;Manual;Personal  Consult Status Consult Status: Follow-up Date: 06/23/23 Follow-up type: In-patient    Hannah Clark 06/23/2023, 1:52 PM

## 2023-06-23 NOTE — Progress Notes (Signed)
 Postpartum Progress Note  PPD#1 s/p VBAC c/b PPH  S: Patient seen and examined at bedside. Reports feeling overall well. Pain well controlled, ambulating, tolerating regular diet, voiding without issue. Passing flatus, has not yet had a bowel movement. Denies fever, chills, chest pain, shortness of breath.  Feeding: plans to breastfeed, plans to bottle feed Circ: N/A - female neonate  O:  Vitals:   06/22/23 1424 06/22/23 1931  BP: 106/60 (!) 102/56  Pulse: 92 86  Resp: 18 18  Temp: 97.8 F (36.6 C) 98.1 F (36.7 C)  SpO2:  99%    PE:  GA: well appearing, NAD CV: RRR, normal S1, S2 Lungs: CTAB Abd: soft, appropriately tender, fundus firm below umbilicus Peri: moderate lochia Ext: no TTP, +1 non-pitting edema  Labs:  Lab Results  Component Value Date   WBC 24.6 (H) 06/22/2023   HGB 9.0 (L) 06/22/2023   HCT 26.7 (L) 06/22/2023   MCV 77.4 (L) 06/22/2023   PLT 136 (L) 06/22/2023    A/P:  27 y.o.yo [id number] PPD#1 s/p VBAC (EBL 984 mL) with anxiety on Lexapro , doing well and progressing appropriately. Vitals within normal limits. Physical exam benign. Labs normal. Plan as follows:   #Routine OB - Regular diet, HLIV - ERAS for pain control  - Rh positive - DVT ppx: ambulating and SCDs - BCM: Unsure  #Neonate -plans to breastfeed, plans to bottle feed   #PPH - pt s/p vaginal packing and po methergine  series - Repeat CBC today - If CBC stable, plan for discharge home today   Hannah Springer, MD

## 2023-06-26 ENCOUNTER — Other Ambulatory Visit: Payer: Self-pay | Admitting: Obstetrics

## 2023-06-27 ENCOUNTER — Inpatient Hospital Stay (HOSPITAL_COMMUNITY): Payer: Commercial Managed Care - PPO

## 2023-06-28 ENCOUNTER — Non-Acute Institutional Stay (HOSPITAL_COMMUNITY)
Admission: RE | Admit: 2023-06-28 | Discharge: 2023-06-28 | Disposition: A | Discharge status: Home / Self Care | Payer: Commercial Managed Care - PPO | Source: Ambulatory Visit | Attending: Internal Medicine | Admitting: Internal Medicine

## 2023-06-28 MED ORDER — IRON SUCROSE 500 MG IVPB - SIMPLE MED
500.0000 mg | INTRAVENOUS | Status: DC
  Administered 2023-06-28: 500 mg via INTRAVENOUS
  Filled 2023-06-28: qty 500

## 2023-06-28 MED ORDER — DIPHENHYDRAMINE HCL 25 MG PO CAPS
25.0000 mg | ORAL_CAPSULE | ORAL | Status: DC
  Administered 2023-06-28: 25 mg via ORAL
  Filled 2023-06-28: qty 1

## 2023-06-28 MED ORDER — ACETAMINOPHEN 500 MG PO TABS
500.0000 mg | ORAL_TABLET | ORAL | Status: DC
  Administered 2023-06-28: 500 mg via ORAL
  Filled 2023-06-28: qty 1

## 2023-06-28 MED ORDER — SODIUM CHLORIDE 0.9 % IV SOLN: INTRAVENOUS | Status: DC | PRN

## 2023-06-28 NOTE — Progress Notes (Addendum)
 PATIENT CARE CENTER NOTE  Diagnosis: Postpartum hemorrhage, delivered [O72.1]    Provider: D'Iorio, Tresa Frohlich, MD   Procedure: Venofer  500 mg   Note: Patient received Venofer  500 mg infusion (dose #1 of 2) via PIV. Pt pre-medicated per orders with PO Tylenol  and Benadryl . Pt tolerated the infusion with no adverse reaction. Vital signs are stable. AVS printed and given to pt. Pt advised that she should return to clinic in 2 weeks for next infusion, and pt can schedule appointment at the front desk. Pt is alert, oriented, and ambulatory at discharge.Pt discharged home with husband.

## 2023-07-04 ENCOUNTER — Telehealth (HOSPITAL_COMMUNITY): Payer: Self-pay | Admitting: *Deleted

## 2023-07-04 NOTE — Telephone Encounter (Signed)
07/04/2023  Name: Hannah Clark MRN: [id number] DOB: 01-Mar-1996  Reason for Call:  Transition of Care Hospital Discharge Call  Contact Status: Patient Contact Status: Message  Language assistant needed:          Follow-Up Questions:    Inocente Salles Postnatal Depression Scale:  In the Past 7 Days:    PHQ2-9 Depression Scale:     Discharge Follow-up:    Post-discharge interventions: NA  Salena Saner, RN 07/03/2022 12:26

## 2023-07-14 ENCOUNTER — Non-Acute Institutional Stay (HOSPITAL_COMMUNITY)
Admission: RE | Admit: 2023-07-14 | Discharge: 2023-07-14 | Disposition: A | Discharge status: Home / Self Care | Payer: Commercial Managed Care - PPO | Source: Ambulatory Visit | Attending: Internal Medicine

## 2023-07-14 MED ORDER — ACETAMINOPHEN 500 MG PO TABS
500.0000 mg | ORAL_TABLET | Freq: Once | ORAL | Status: AC
  Administered 2023-07-14: 500 mg via ORAL
  Filled 2023-07-14: qty 1

## 2023-07-14 MED ORDER — IRON SUCROSE 500 MG IVPB - SIMPLE MED
500.0000 mg | Freq: Once | INTRAVENOUS | Status: AC
  Administered 2023-07-14: 500 mg via INTRAVENOUS
  Filled 2023-07-14: qty 500

## 2023-07-14 MED ORDER — DIPHENHYDRAMINE HCL 25 MG PO CAPS
25.0000 mg | ORAL_CAPSULE | Freq: Once | ORAL | Status: AC
  Administered 2023-07-14: 25 mg via ORAL
  Filled 2023-07-14: qty 1

## 2023-07-14 MED ORDER — SODIUM CHLORIDE 0.9 % IV SOLN: INTRAVENOUS | Status: DC | PRN

## 2023-07-14 NOTE — Progress Notes (Addendum)
PATIENT CARE CENTER NOTE  Diagnosis:  Postpartum hemorrhage, delivered [O72.1]    Provider: Barnett Hatter MD  Procedure: Venofer 500 mg   Note: Patient received Venofer 500 mg infusion (dose #2 of 2). Pt pre-medicated per orders with PO Tylenol and Benadryl. Pt tolerated the infusion with no adverse reaction. Vital signs are stable. AVS printed and given to patient. Pt is alert, oriented, and ambulatory at discharge. Pt discharged home with her husband.

## 2023-07-19 ENCOUNTER — Other Ambulatory Visit: Payer: Self-pay | Admitting: Certified Nurse Midwife

## 2023-07-19 DIAGNOSIS — N6310 Unspecified lump in the right breast, unspecified quadrant: Secondary | ICD-10-CM

## 2023-07-24 ENCOUNTER — Other Ambulatory Visit: Payer: Self-pay | Admitting: Certified Nurse Midwife

## 2023-07-24 DIAGNOSIS — N6489 Other specified disorders of breast: Secondary | ICD-10-CM

## 2023-07-24 DIAGNOSIS — R921 Mammographic calcification found on diagnostic imaging of breast: Secondary | ICD-10-CM

## 2023-08-02 ENCOUNTER — Other Ambulatory Visit: Payer: Self-pay | Admitting: Certified Nurse Midwife

## 2023-08-02 ENCOUNTER — Ambulatory Visit
Admission: RE | Admit: 2023-08-02 | Discharge: 2023-08-02 | Disposition: A | Discharge status: Home / Self Care | Payer: Commercial Managed Care - PPO | Source: Ambulatory Visit | Attending: Certified Nurse Midwife | Admitting: Certified Nurse Midwife

## 2023-08-02 ENCOUNTER — Ambulatory Visit: Payer: Commercial Managed Care - PPO

## 2023-08-02 DIAGNOSIS — R921 Mammographic calcification found on diagnostic imaging of breast: Secondary | ICD-10-CM

## 2023-08-02 DIAGNOSIS — N6489 Other specified disorders of breast: Secondary | ICD-10-CM

## 2023-08-03 ENCOUNTER — Other Ambulatory Visit: Payer: Commercial Managed Care - PPO

## 2023-08-17 ENCOUNTER — Other Ambulatory Visit: Payer: Commercial Managed Care - PPO

## 2023-08-28 ENCOUNTER — Other Ambulatory Visit: Payer: Self-pay

## 2023-08-28 ENCOUNTER — Other Ambulatory Visit (HOSPITAL_COMMUNITY): Payer: Self-pay | Admitting: Obstetrics & Gynecology

## 2023-08-28 ENCOUNTER — Encounter (HOSPITAL_COMMUNITY): Payer: Self-pay | Admitting: Obstetrics & Gynecology

## 2023-08-28 ENCOUNTER — Observation Stay (HOSPITAL_COMMUNITY)
Admission: AD | Admit: 2023-08-28 | Discharge: 2023-08-30 | Disposition: A | Discharge status: Home / Self Care | Attending: Obstetrics & Gynecology | Admitting: Obstetrics & Gynecology

## 2023-08-28 ENCOUNTER — Inpatient Hospital Stay (HOSPITAL_COMMUNITY)

## 2023-08-28 DIAGNOSIS — O9123 Nonpurulent mastitis associated with lactation: Secondary | ICD-10-CM | POA: Diagnosis present

## 2023-08-28 DIAGNOSIS — N61 Mastitis without abscess: Secondary | ICD-10-CM | POA: Diagnosis present

## 2023-08-28 DIAGNOSIS — J45909 Unspecified asthma, uncomplicated: Secondary | ICD-10-CM | POA: Diagnosis not present

## 2023-08-28 DIAGNOSIS — O9122 Nonpurulent mastitis associated with the puerperium: Secondary | ICD-10-CM | POA: Insufficient documentation

## 2023-08-28 DIAGNOSIS — O9953 Diseases of the respiratory system complicating the puerperium: Secondary | ICD-10-CM | POA: Diagnosis not present

## 2023-08-28 LAB — CBC
HCT: 39.2 % (ref 36.0–46.0)
Hemoglobin: 12.6 g/dL (ref 12.0–15.0)
MCH: 26.5 pg (ref 26.0–34.0)
MCHC: 32.1 g/dL (ref 30.0–36.0)
MCV: 82.4 fL (ref 80.0–100.0)
Platelets: 242 10*3/uL (ref 150–400)
RBC: 4.76 MIL/uL (ref 3.87–5.11)
RDW: 15.6 % — ABNORMAL HIGH (ref 11.5–15.5)
WBC: 6.9 10*3/uL (ref 4.0–10.5)
nRBC: 0 % (ref 0.0–0.2)

## 2023-08-28 LAB — CREATININE, SERUM
Creatinine, Ser: 0.77 mg/dL (ref 0.44–1.00)
GFR, Estimated: >60 mL/min (ref >60)

## 2023-08-28 LAB — TYPE AND SCREEN
ABO/RH(D): O POS
Antibody Screen: NEGATIVE

## 2023-08-28 MED ORDER — DOCUSATE SODIUM 100 MG PO CAPS
100.0000 mg | ORAL_CAPSULE | Freq: Every day | ORAL | Status: DC
  Filled 2023-08-28: qty 1

## 2023-08-28 MED ORDER — SODIUM CHLORIDE 0.9% FLUSH: 3.0000 mL | INTRAVENOUS | Status: DC | PRN

## 2023-08-28 MED ORDER — IBUPROFEN 600 MG PO TABS
600.0000 mg | ORAL_TABLET | Freq: Four times a day (QID) | ORAL | Status: DC | PRN
  Administered 2023-08-28 – 2023-08-30 (×5): 600 mg via ORAL
  Filled 2023-08-28 (×5): qty 1

## 2023-08-28 MED ORDER — SODIUM CHLORIDE 0.9% FLUSH
3.0000 mL | Freq: Two times a day (BID) | INTRAVENOUS | Status: DC
  Administered 2023-08-29: 3 mL via INTRAVENOUS
  Administered 2023-08-29: 10 mL via INTRAVENOUS
  Administered 2023-08-30: 3 mL via INTRAVENOUS

## 2023-08-28 MED ORDER — ACETAMINOPHEN 325 MG PO TABS
650.0000 mg | ORAL_TABLET | ORAL | Status: DC | PRN
  Administered 2023-08-28 – 2023-08-30 (×3): 650 mg via ORAL
  Filled 2023-08-28 (×3): qty 2

## 2023-08-28 MED ORDER — PRENATAL MULTIVITAMIN CH
1.0000 | ORAL_TABLET | Freq: Every day | ORAL | Status: DC
  Administered 2023-08-29 – 2023-08-30 (×2): 1 via ORAL
  Filled 2023-08-28 (×2): qty 1

## 2023-08-28 MED ORDER — CALCIUM CARBONATE ANTACID 500 MG PO CHEW: 2.0000 | CHEWABLE_TABLET | ORAL | Status: DC | PRN

## 2023-08-28 MED ORDER — VANCOMYCIN HCL 750 MG/150ML IV SOLN
750.0000 mg | Freq: Three times a day (TID) | INTRAVENOUS | Status: DC
  Administered 2023-08-28 – 2023-08-29 (×3): 750 mg via INTRAVENOUS
  Filled 2023-08-28 (×5): qty 150

## 2023-08-28 MED ORDER — SODIUM CHLORIDE 0.9 % IV SOLN
2.0000 g | INTRAVENOUS | Status: DC
  Administered 2023-08-28: 2 g via INTRAVENOUS
  Filled 2023-08-28: qty 20

## 2023-08-28 NOTE — H&P (Signed)
 Postpartum Admission H&P   Admission Diagnosis: Mastitis refractory to oral antibiotics   S: Hannah Clark is a 28yo G83P2002 female 2 months postpartum s/p SVD presenting with lactational mastitis. Patient has history of recurrent mastitis previous postpartum period. Reports started to have typical mastitis symptoms starting last week - right breast tenderness, erythema, heat, and swelling. Patient called into office and was prescribed 10 day course of keflex 500mg  QID. She is on day 5 of antibiotic therapy without significant improvement. She reports fever two days ago, worsening right breast pain and continued erythema. Had a blister develop on her nipple that burst yesterday and released some blood and milk. Had a breast ultrasound last month that showed mastitis in right breast without clinical signs or symptoms.   O:  Vitals: BP 104/62   Wt 174.6 Ht 30ft 4in  BMI 30  PE:  GA: well appearing, NAD Chest: normal work of breathing on room air Breasts: L breast normal; R breast enlarged with area of warmth and erythema on lower inner quadrant of breast. Tender to palpation,indurated but no fluctuance noted. Nipple cracked and irritated appearing with remnants of burst blister.  Ext: warm, well perfused  A/P: Hannah Clark is a 28yo G2P2002 female 2 months postpartum s/p SVD presenting with lactational mastitis after failed outpatient treatment. Vitals within normal limits. Physical exam notable for mastitis of right breast. Given patient now on 5th day of antibiotic course without improvement in symptoms or exam, recommend admission for IV antibiotics. Plan for Vanc and Zosyn for 24-48 hours. Plan for breast ultrasound to evaluate for abscess.   Marlene Bast, MD

## 2023-08-28 NOTE — Progress Notes (Signed)
 Pharmacy Antibiotic Note  Hannah Clark is a 28 y.o. female admitted on 08/28/2023 with mastitis refractory against oral therapy.  Pharmacy has been consulted for Vancomycin dosing in patient that is 2 months postpartum. Zosyn changed to Ceftriaxone due to penicillin allergy.   Plan: Vancomycin 750 IV every 8 hours.  Goal trough 10-15 mcg/mL. Based on AUC dosing: Calculated AUC: 498 Calculated Css min: 16.1 Calculated Css max: 28 Height: 5\' 4"  (162.6 cm) Weight: 79 kg (174 lb 3.2 oz) IBW/kg (Calculated) : 54.7  Temp (24hrs), Avg:98.3 F (36.8 C), Min:98.3 F (36.8 C), Max:98.3 F (36.8 C)  Recent Labs  Lab 08/28/23 1836  WBC 6.9  CREATININE 0.77    Estimated Creatinine Clearance: 106.4 mL/min (by C-G formula based on SCr of 0.77 mg/dL).    Allergies  Allergen Reactions   Penicillins Hives, Nausea And Vomiting and Rash    Childhood Pt states received PCN with last pregnancy and broke out in hives and lips were swollen.   Meat [Alpha-Gal] Diarrhea and Nausea And Vomiting    Red meat and Pork Bodyache   Amoxicillin Nausea And Vomiting   Aviane [Levonorgestrel-Ethinyl Estrad]     Worsened PMS symptoms, SE   Erythromycin Nausea Only    Antimicrobials this admission: Ceftriaxone 2 gram IV q24h  3/17 >>  Thank you for allowing pharmacy to be a part of this patient's care.  Claybon Jabs 08/28/2023 7:41 PM

## 2023-08-29 DIAGNOSIS — O9122 Nonpurulent mastitis associated with the puerperium: Secondary | ICD-10-CM | POA: Diagnosis not present

## 2023-08-29 DIAGNOSIS — N61 Mastitis without abscess: Secondary | ICD-10-CM | POA: Diagnosis present

## 2023-08-29 DIAGNOSIS — O9123 Nonpurulent mastitis associated with lactation: Secondary | ICD-10-CM | POA: Diagnosis not present

## 2023-08-29 MED ORDER — SULFAMETHOXAZOLE-TRIMETHOPRIM 800-160 MG PO TABS
1.0000 | ORAL_TABLET | Freq: Two times a day (BID) | ORAL | Status: DC
  Administered 2023-08-29 – 2023-08-30 (×2): 1 via ORAL
  Filled 2023-08-29 (×3): qty 1

## 2023-08-29 MED ORDER — ESCITALOPRAM OXALATE 10 MG PO TABS
5.0000 mg | ORAL_TABLET | Freq: Every day | ORAL | Status: DC
  Administered 2023-08-29 – 2023-08-30 (×2): 5 mg via ORAL
  Filled 2023-08-29 (×2): qty 1

## 2023-08-29 NOTE — Progress Notes (Signed)
 Pt asked for discussion of care Not understanding why change to oral abx and concern d/t her h/o recurrent mastitis after prior pregnancy  No new c/o, breast much improved  Patient Vitals for the past 24 hrs:  BP Temp Temp src Pulse Resp SpO2 Height Weight  08/29/23 1556 113/73 99.2 F (37.3 C) Oral 85 16 98 % -- --  08/29/23 1141 110/63 97.8 F (36.6 C) Oral 80 16 98 % -- --  08/29/23 0736 (!) 102/56 98.1 F (36.7 C) Oral 78 16 99 % -- --  08/29/23 0425 (!) 91/50 97.8 F (36.6 C) Oral 75 16 98 % -- --  08/29/23 0004 (!) 84/38 97.8 F (36.6 C) Oral 79 16 98 % -- --  08/28/23 2022 118/61 98.2 F (36.8 C) Oral 82 18 99 % -- --  08/28/23 1708 120/74 98.3 F (36.8 C) Oral 93 18 98 % 5\' 4"  (1.626 m) 79 kg    HD 2, pp mastitis  S/p 24 hr ib abx, no fevers; significant improvement of pain/redness right breast; reviewed consult from ID and recommendation for bactrim x10 days; will monitor closely for any worsening, can plan ID consult as outpt if needed; pt also has outpt MRI planned d/t persistent breas peau d'orange right breast per pt; pt reassured after being able to discuss her history and knowing she will have close outpt f/u

## 2023-08-29 NOTE — Progress Notes (Signed)
 Patient ID: Hannah Clark, female   DOB: 04-19-1996, 28 y.o.   MRN: [id number] HD #2  Right breast severe mastitis, failed oral therapy, here for IV meds   S: feels better, last fever was 3 days back but sweaty overnight. 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  Prelim breast sono- no abscess but appears worse than last sono 4 wks back  PCN allergy, failed Keflex  IV Vancomycin and Rocephin since admission and is improving, will need 48 hrs of IV and started discharge planning for tomorrow for home health to complete 10 more days of IV meds  Pharmacy and ID consults and social work consult to co-ordinate home IV antibiotics  Pt advised to take a probiotic, baby is already getting probiotic drops, stool color changed but baby oka, advised to check with her Peds   Anxiety-depression, cont Lexapro  PP care routine PNvit, pain meds Tylenol/ Ibuprpfen with food   Anticilate dc tomorrow

## 2023-08-29 NOTE — Consult Note (Signed)
 [facility]    Date of Admission:  08/28/2023   Total days of inpatient antibiotics 1        Reason for Consult: Mastitis    Principal Problem:   Mastitis associated with childbirth, delivered Active Problems:   Mastitis   Assessment: 28 year old female G2P202 2 months postpartum status post SVD, history of recurrent mastitis.  Developed right breast tenderness was prescribed Keflex 500 mg 4 times daily x 10 days.  5 antibiotics continue to have fever for IV antibiotics #Recurrent mastitis and postpartum female - Started vancomycin and ceftriaxone.  Erythema has resolved.  Patient's sister feels that her breast tissue is somewhat hard.  As erythema is resolved and counseled patient that normalization of tissue may take some time.  No longer having fevers.  Will plan to switch to p.o. antibiotics  Recommendations:  -Complete 10 days of antibiotics with Bactrim 3/26 - Discussed with primary -blood cultures to be completed - Standard precautions - ID will sign off Microbiology:   Antibiotics: Vancomycin and ceftriaxone 3/17-present    HPI: Hannah Clark is a 28 y.o. female G2 P2, 2 status post SVD presented with lactational mastitis.  She has a history of recurrent mastitis.  She was seen week prior to admission with right breast tenderness and given Keflex x 10 days.  On day 5 of antibiotic she still had fevers without much improvement.  Admitted started on Comycin and ceftriaxone.  ID engaged for antibiotic recommendations.   Review of Systems: Review of Systems  All other systems reviewed and are negative.   Past Medical History:  Diagnosis Date   Anxiety    Asthma    Depression    Headache     Social History   Tobacco Use   Smoking status: Never   Smokeless tobacco: Never  Substance Use Topics   Alcohol use: No   Drug use: No    Family History  Problem Relation Age of Onset   Hypertension Mother    Depression Mother     Fibromyalgia Mother    Diverticulitis Mother    Migraines Mother    Diabetes Mother    Hypertension Father    Diverticulitis Father    Heart disease Father    Heart attack Father    Depression Maternal Grandmother    Diabetes Maternal Grandmother    Fibromyalgia Maternal Grandmother    Cirrhosis Maternal Grandmother    Hypertension Paternal Grandmother    Dementia Paternal Grandmother    Heart attack Paternal Grandfather    Scheduled Meds:  docusate sodium  100 mg Oral Daily   escitalopram  5 mg Oral Daily   prenatal multivitamin  1 tablet Oral Q1200   sodium chloride flush  3-10 mL Intravenous Q12H   sulfamethoxazole-trimethoprim  1 tablet Oral Q12H   Continuous Infusions: PRN Meds:.acetaminophen, calcium carbonate, ibuprofen, sodium chloride flush Allergies  Allergen Reactions   Penicillins Hives, Nausea And Vomiting and Rash    Childhood Pt states received PCN with last pregnancy and broke out in hives and lips were swollen.   Meat [Alpha-Gal] Diarrhea and Nausea And Vomiting    Red meat and Pork Bodyache   Amoxicillin Nausea And Vomiting   Aviane [Levonorgestrel-Ethinyl Estrad]     Worsened PMS symptoms, SE   Erythromycin Nausea Only    OBJECTIVE: Blood pressure 110/63, pulse 80, temperature 97.8 F (36.6 C), temperature source Oral, resp. rate 16, height 5\' 4"  (1.626 m),  weight 79 kg, SpO2 98%, unknown if currently breastfeeding.  Physical Exam Constitutional:      Appearance: Normal appearance.  HENT:     Head: Normocephalic and atraumatic.     Right Ear: Tympanic membrane normal.     Left Ear: Tympanic membrane normal.     Nose: Nose normal.     Mouth/Throat:     Mouth: Mucous membranes are moist.  Eyes:     Extraocular Movements: Extraocular movements intact.     Conjunctiva/sclera: Conjunctivae normal.     Pupils: Pupils are equal, round, and reactive to light.  Cardiovascular:     Rate and Rhythm: Normal rate and regular rhythm.     Heart  sounds: No murmur heard.    No friction rub. No gallop.  Pulmonary:     Effort: Pulmonary effort is normal.     Breath sounds: Normal breath sounds.  Abdominal:     General: Abdomen is flat.     Palpations: Abdomen is soft.  Musculoskeletal:        General: Normal range of motion.  Skin:    General: Skin is warm and dry.  Neurological:     General: No focal deficit present.     Mental Status: She is alert and oriented to person, place, and time.  Psychiatric:        Mood and Affect: Mood normal.     Lab Results Lab Results  Component Value Date   WBC 6.9 08/28/2023   HGB 12.6 08/28/2023   HCT 39.2 08/28/2023   MCV 82.4 08/28/2023   PLT 242 08/28/2023    Lab Results  Component Value Date   CREATININE 0.77 08/28/2023   No results found for: "ALT", "AST", "GGT", "ALKPHOS", "BILITOT"     Danelle Earthly, MD [facility] 08/29/2023, 2:38 PM

## 2023-08-29 NOTE — Social Work (Signed)
 CSW notified RNCM about patient's need for Home Health/ DME needs.   Vivi Barrack, MSW, LCSW Clinical Social Worker  [phone] 08/29/2023  1:02 PM

## 2023-08-29 NOTE — Lactation Note (Addendum)
 Lactation Consultation Note  Patient Name: Hannah Clark Date: 08/29/2023 Age:28 y.o., post partum -  Reason for consult: Initial assessment- Readmit due unresolved mastitis after 5 days of Keflex orally.  Last temperature 2 days ago. Afebrile now.  LC offered to assess breast tissue and mom receptive, noted a small scabbed area on the nipple ( former blister) no drainage. Inner aspect of the right breast dull redness, warm and tender. Per mom feeling better since the IV antibiotics.  IV antibiotics - Rocephin and Vancomycin. Both L1's per [facility]Mother's Milk resource manual.  Mom also had a breast U/S to r/o breast abscess. According to the  Dr. Camillia Herter note.  LC offered to set up a DEBP and per mom brought her own hand pump and prefers it with #21 F which the nurse obtained for her.  LC recommended if the baby feeds on the the effected side 1st and is still hungry and breast is still somewhat full to relatch her on the same breast and allow the baby to help you soften the right breast well. And if she doesn't feed on the left breast right away release it down.  Mom aware she can call for [facility] if needed.  Maternal Data Has patient been taught Hand Expression?:  (mom experienced)  Feeding Mother's Current Feeding Choice: Breast Milk  LATCH Score Latch:  (per mom latching well on the effected right breast and the left)  Lactation Tools Discussed/Used Tools: Pump;Flanges (LC offered to set up a DEBP and mom declined and per mom prefers to use the hand pump she brought from home with #21 F) Flange Size: 21 Breast pump type: Manual Reason for Pumping: mastitis  Interventions  Education and recommendations ( see above note)   Discharge Pump: Manual;Personal (per mom prefers a hand pump) DEBP hands free  Consult Status Consult Status: Follow-up Date: 08/30/23 Follow-up type: In-patient    Matilde Sprang Jaelee Laughter 08/29/2023, 12:21 PM

## 2023-08-29 NOTE — TOC Initial Note (Signed)
 Transition of Care [facility]) - Initial/Assessment Note    Patient Details  Name: Hannah Clark MRN: [id number] Date of Birth: 08-02-1995  [facility]) CM/SW Contact:    Geoffery Lyons, RN Phone Number:[phone] 08/29/2023, 10:45 AM  Clinical Narrative:                  CM had consult for home IV antibiotics for patient. CM met with patient and her mother in room. Patient reviewed demographics with CM and they are correct in system along with the insurance and the husband is the Media planner and his name is Kajal Scalici DOB- 02/15/96 and phone # [phone]. Patient drives and has no barriers with transportation and has baby that delivered January 9th in room with patient.  CM offered choice for HH IV antibiotics and patient did not have preference. CM called Pam with [facility] with referral. CM awaiting ID consult and orders.  Prior Living Arrangements/Services Lives at home with husband and kids  Activities of Daily Living   ADL Screening (condition at time of admission) Independently performs ADLs?: Yes (appropriate for developmental age) Is the patient deaf or have difficulty hearing?: No Does the patient have difficulty seeing, even when wearing glasses/contacts?: No Does the patient have difficulty concentrating, remembering, or making decisions?: No    Admission diagnosis:  Mastitis associated with childbirth, delivered [O91.22] Patient Active Problem List   Diagnosis Date Noted   Mastitis associated with childbirth, delivered 08/28/2023   VBAC (vaginal birth after Cesarean) 06/22/2023   Vaginal laceration 06/22/2023   Postpartum care following vaginal delivery 1/9 06/22/2023   Anxiety 06/22/2023   Normal labor 06/21/2023   PCP:  Dr. Misty Stanley # [phone]- [facility] ( [facility] ) Pharmacy:   [facility] [place], [place] - [address] [facility] [address] [place]  [address] Phone: [phone] Fax: [phone]  [facility] - [place], [place] - [address] [place] [address] Phone: [phone] Fax: [phone]     Social Drivers of Health (SDOH) Social History: SDOH Screenings   Food Insecurity: No Food Insecurity (08/28/2023)  Housing: Low Risk  (08/28/2023)  Transportation Needs: No Transportation Needs (08/28/2023)  Utilities: Not At Risk (08/28/2023)  Financial Resource Strain: Low Risk  (10/04/2022)   Received from [facility]  Physical Activity: Insufficiently Active (10/04/2022)   Received from [facility]  Social Connections: Socially Integrated (10/04/2022)   [facility] from [facility]  Stress: No Stress Concern Present (10/04/2022)   Received from [facility]  Tobacco Use: Low Risk  (08/28/2023)   SDOH Interventions:     Readmission Risk Interventions     No data to display

## 2023-08-30 DIAGNOSIS — O9123 Nonpurulent mastitis associated with lactation: Secondary | ICD-10-CM | POA: Diagnosis not present

## 2023-08-30 MED ORDER — ACETAMINOPHEN 500 MG PO TABS: 1000.0000 mg | ORAL_TABLET | Freq: Four times a day (QID) | ORAL | 0 refills | Status: AC | PRN

## 2023-08-30 MED ORDER — IBUPROFEN 600 MG PO TABS: 600.0000 mg | ORAL_TABLET | Freq: Four times a day (QID) | ORAL | Status: AC

## 2023-08-30 MED ORDER — SULFAMETHOXAZOLE-TRIMETHOPRIM 800-160 MG PO TABS: 1.0000 | ORAL_TABLET | Freq: Two times a day (BID) | ORAL | 0 refills | Status: DC

## 2023-08-30 MED ORDER — SULFAMETHOXAZOLE-TRIMETHOPRIM 800-160 MG PO TABS: 1.0000 | ORAL_TABLET | Freq: Two times a day (BID) | ORAL | 0 refills | Status: AC

## 2023-08-30 NOTE — TOC Transition Note (Signed)
 Transition of Care [facility]) - Discharge Note   Patient Details  Name: Hannah Clark MRN: [id number] Date of Birth: Jan 04, 1996  Transition of Care [facility]) CM/SW Contact:  Geoffery Lyons, RN Phone Number:[phone] 08/30/2023, 9:18 AM   Clinical Narrative:     CM received message from MD (provider) that patient will NOT need IV antibiotics after discharge plans. Patient will plan to go home with PO antibiotics. Patient does have PCP and does not have transportation needs.     Social Drivers of Health (SDOH) Interventions SDOH Screenings   Food Insecurity: No Food Insecurity (08/28/2023)  Housing: Low Risk  (08/28/2023)  Transportation Needs: No Transportation Needs (08/28/2023)  Utilities: Not At Risk (08/28/2023)  Financial Resource Strain: Low Risk  (10/04/2022)   Received from [facility]  Physical Activity: Insufficiently Active (10/04/2022)   Received from [facility]  Social Connections: Socially Integrated (10/04/2022)   Received from [facility]  Stress: No Stress Concern Present (10/04/2022)   Received from [facility]  Tobacco Use: Low Risk  (08/28/2023)     Readmission Risk Interventions     No data to display

## 2023-08-30 NOTE — Progress Notes (Signed)
 HD 2, pp mastitis  No new c/o; redness/pain continues to get better; now only on lateral/inferior aspect of breast Edema noted superior/medial aspect but no erythema No f/c/s  Patient Vitals for the past 24 hrs:  BP Temp Temp src Pulse Resp SpO2  08/30/23 0810 101/60 97.9 F (36.6 C) Oral 78 16 98 %  08/30/23 0600 (!) 100/55 97.9 F (36.6 C) Oral 69 16 97 %  08/29/23 1935 114/73 98.3 F (36.8 C) Oral 79 17 100 %  08/29/23 1556 113/73 99.2 F (37.3 C) Oral 85 16 98 %   A&0x3 Nml respirations Left breast, wnl; both breasts large and pendulous Right breast: erythema noted lateral/inferior aspect, mild tenderness; no masses; edema medial aspect of breast  Breast u/s: no evidence of abscess; right breast edema/inflammation c/w mastitis  A/P:  pp mastitis Resolving, much improved and no abscess per u/s; pt remains afebrile; s/p ID consult and pt was changed to bactrim yesterday pm, plan for bactrim x10 days; d/c home today and plan office f/u in 2 days; will then continued outpt f/u to follow for recurrence d/t pt history after prior child; no questions and pt understands precautions for worsening signs/symptoms. Continue probiotic Continue breastfeeding and working with lactation

## 2023-08-30 NOTE — Progress Notes (Signed)
Discharge instructions and prescriptions given to pt. Discussed signs and symptoms to report to the MD, upcoming appointments, and meds. Pt verbalizes understanding and has no questions or concerns at this time. Pt discharged home from hospital in stable condition. 

## 2023-08-30 NOTE — Discharge Summary (Signed)
 Physician Discharge Summary  Patient ID: Hannah Clark MRN: [id number] DOB/AGE: 29-21-1997 28 y.o.  Admit date: 08/28/2023 Discharge date: 08/30/2023  Admission Diagnoses:  Discharge Diagnoses:  Principal Problem:   Mastitis associated with childbirth, delivered Active Problems:   Mastitis   Discharged Condition: good  Hospital Course: 28 y/o G2P2 admitted for severe mastitis of right breast with failed medical therpay with keflex x5 days. The patient was admitted and initially started on IV abx - vancomycin and rocephin x24 hrs then changed to bactrim after afebrile and improvement of clinical course. ID consulted and recommended change to bactrim for oral therapy and plan 10d on bactrim. U/s performed and c/w mastitis, no abscess. 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 She has remained afebrile during course and is ready for d/c home.  Consults: ID  Significant Diagnostic Studies: radiology: Ultrasound: u/s c/s mastitis and no abscess  Treatments: antibiotics: vancomycin, rocephin x24hr, bactrim  Discharge Exam: Blood pressure 112/72, pulse 75, temperature 98 F (36.7 C), temperature source Oral, resp. rate 17, height 5\' 4"  (1.626 m), weight 79 kg, SpO2 100%, unknown if currently breastfeeding.   Disposition: Discharge disposition: 01-Home or Self Care       Discharge Instructions     Call MD for:  redness, tenderness, or signs of infection (pain, swelling, redness, odor or green/yellow discharge around incision site)   Complete by: As directed    Call MD for:  temperature >100.4   Complete by: As directed    Diet - low sodium heart healthy   Complete by: As directed    Discharge instructions   Complete by: As directed    F/u 2 days Inform if worsening symptoms      Allergies as of 08/30/2023       Reactions   Penicillins Hives, Nausea And Vomiting, Rash   Childhood Pt states received PCN  with last pregnancy and broke out in hives and lips were swollen.   Meat [alpha-gal] Diarrhea, Nausea And Vomiting   Red meat and Pork Bodyache   Amoxicillin Nausea And Vomiting   Aviane [levonorgestrel-ethinyl Estrad]    Worsened PMS symptoms, SE   Erythromycin Nausea Only        Medication List     STOP taking these medications    cephALEXin 500 MG capsule Commonly known as: KEFLEX       TAKE these medications    acetaminophen 500 MG tablet Commonly known as: TYLENOL Take 2 tablets (1,000 mg total) by mouth every 6 (six) hours as needed for mild pain (pain score 1-3).   EPINEPHrine 0.3 mg/0.3 mL Soaj injection Commonly known as: EPI-PEN Inject 0.3 mg into the muscle as needed for anaphylaxis.   escitalopram 5 MG tablet Commonly known as: LEXAPRO Take 1 tablet (5 mg total) by mouth daily.   ferrous sulfate 325 (65 FE) MG EC tablet Take 325 mg by mouth 3 (three) times daily with meals.   ibuprofen 600 MG tablet Commonly known as: ADVIL Take 1 tablet (600 mg total) by mouth every 6 (six) hours.   pantoprazole 20 MG tablet Commonly known as: PROTONIX Take 20 mg by mouth daily.   PRE-NATAL FORMULA PO Take 1 tablet by mouth daily at 12 noon. Nature made   PRO HERBS BREAST HEALTH PO Take by mouth.   psyllium 58.6 % powder Commonly known as: METAMUCIL Take 1 packet by mouth 3 (three) times daily.  sulfamethoxazole-trimethoprim 800-160 MG tablet Commonly known as: BACTRIM DS Take 1 tablet by mouth every 12 (twelve) hours for 10 days.   ZyrTEC Allergy 10 MG tablet Generic drug: cetirizine Take 10 mg by mouth daily.        Follow-up Information     Antonio Creswell, Candace Gallus, MD Follow up.   Specialty: Obstetrics and Gynecology Contact information: [address] [place] [address] [phone]                 Signed: Vick Frees 08/30/2023, 5:33 PM

## 2023-08-30 NOTE — Lactation Note (Signed)
 Lactation Consultation Note  Patient Name: Hannah Clark Date: 08/30/2023 Age:28 y.o.   LC in to visit with P35 Mom of 41 month old baby girl.  Mom was admitted for IV antibiotics for lactational mastitis in right breast.  Mom still has some inner aspect edema and erythema on outer aspect.  Mom is afebrile and LC watched as baby latched well for a feeding.    Reviewed importance of taking probiotics and not over pumping, but using ice packs for edema/swelling.  Encouraged Mom to breastfeed baby with cues.  Ultrasound normal and Mom to have breast MRI next month.  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  Encouraged Mom to continue consulting with her IBCLC Orrin Brigham.  Mom educated on [facility] lactation program through [facility]   Consult Status Consult Status: PRN    Judee Clara 08/30/2023, 1:55 PM

## 2023-10-11 ENCOUNTER — Other Ambulatory Visit: Payer: Commercial Managed Care - PPO

## 2023-10-12 ENCOUNTER — Ambulatory Visit
Admission: RE | Admit: 2023-10-12 | Discharge: 2023-10-12 | Disposition: A | Discharge status: Home / Self Care | Payer: Commercial Managed Care - PPO | Source: Ambulatory Visit | Attending: Certified Nurse Midwife | Admitting: Certified Nurse Midwife

## 2023-10-12 DIAGNOSIS — N6310 Unspecified lump in the right breast, unspecified quadrant: Secondary | ICD-10-CM

## 2023-10-12 MED ORDER — GADOPICLENOL 0.5 MMOL/ML IV SOLN
8.0000 mL | Freq: Once | INTRAVENOUS | Status: AC | PRN
  Administered 2023-10-12: 8 mL via INTRAVENOUS
